# Patient Record
Sex: Female | Born: 1965 | Race: White | Hispanic: No | Marital: Single | Smoking: Former smoker
Health system: Southern US, Community
[De-identification: ages and names within clinical notes are randomized; demographics above are authoritative.]

## PROBLEM LIST (undated history)

## (undated) DIAGNOSIS — C801 Malignant (primary) neoplasm, unspecified: Secondary | ICD-10-CM

## (undated) DIAGNOSIS — F32A Depression, unspecified: Secondary | ICD-10-CM

## (undated) DIAGNOSIS — M722 Plantar fascial fibromatosis: Secondary | ICD-10-CM

## (undated) DIAGNOSIS — N393 Stress incontinence (female) (male): Secondary | ICD-10-CM

## (undated) DIAGNOSIS — D649 Anemia, unspecified: Secondary | ICD-10-CM

## (undated) DIAGNOSIS — F329 Major depressive disorder, single episode, unspecified: Secondary | ICD-10-CM

## (undated) DIAGNOSIS — F419 Anxiety disorder, unspecified: Secondary | ICD-10-CM

## (undated) HISTORY — DX: Plantar fascial fibromatosis: M72.2

## (undated) HISTORY — DX: Malignant (primary) neoplasm, unspecified: C80.1

## (undated) HISTORY — DX: Anemia, unspecified: D64.9

## (undated) HISTORY — DX: Anxiety disorder, unspecified: F41.9

## (undated) HISTORY — PX: TUBAL LIGATION: SHX77

## (undated) HISTORY — DX: Depression, unspecified: F32.A

## (undated) HISTORY — DX: Stress incontinence (female) (male): N39.3

---
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
## 1991-05-07 DIAGNOSIS — C801 Malignant (primary) neoplasm, unspecified: Secondary | ICD-10-CM

## 1991-05-07 HISTORY — DX: Malignant (primary) neoplasm, unspecified: C80.1

## 2003-03-13 ENCOUNTER — Inpatient Hospital Stay (HOSPITAL_COMMUNITY): Admission: AD | Admit: 2003-03-13 | Discharge: 2003-03-13 | Payer: Self-pay | Admitting: Obstetrics and Gynecology

## 2003-10-05 ENCOUNTER — Emergency Department (HOSPITAL_COMMUNITY): Admission: EM | Admit: 2003-10-05 | Discharge: 2003-10-05 | Payer: Self-pay | Admitting: Family Medicine

## 2004-06-03 ENCOUNTER — Emergency Department (HOSPITAL_COMMUNITY): Admission: EM | Admit: 2004-06-03 | Discharge: 2004-06-03 | Payer: Self-pay | Admitting: Emergency Medicine

## 2004-10-22 ENCOUNTER — Other Ambulatory Visit: Admission: RE | Admit: 2004-10-22 | Discharge: 2004-10-22 | Payer: Self-pay | Admitting: Obstetrics and Gynecology

## 2005-02-19 ENCOUNTER — Emergency Department (HOSPITAL_COMMUNITY): Admission: EM | Admit: 2005-02-19 | Discharge: 2005-02-19 | Payer: Self-pay | Admitting: Family Medicine

## 2006-07-17 ENCOUNTER — Ambulatory Visit: Payer: Self-pay | Admitting: Internal Medicine

## 2006-07-31 ENCOUNTER — Encounter (INDEPENDENT_AMBULATORY_CARE_PROVIDER_SITE_OTHER): Payer: Self-pay | Admitting: Internal Medicine

## 2006-07-31 ENCOUNTER — Ambulatory Visit: Payer: Self-pay | Admitting: Internal Medicine

## 2006-08-13 ENCOUNTER — Ambulatory Visit (HOSPITAL_COMMUNITY): Admission: RE | Admit: 2006-08-13 | Discharge: 2006-08-13 | Payer: Self-pay | Admitting: Internal Medicine

## 2006-08-27 ENCOUNTER — Encounter: Admission: RE | Admit: 2006-08-27 | Discharge: 2006-08-27 | Payer: Self-pay | Admitting: Internal Medicine

## 2006-08-27 ENCOUNTER — Ambulatory Visit: Payer: Self-pay | Admitting: *Deleted

## 2006-09-16 ENCOUNTER — Ambulatory Visit (HOSPITAL_COMMUNITY): Admission: RE | Admit: 2006-09-16 | Discharge: 2006-09-16 | Payer: Self-pay | Admitting: Internal Medicine

## 2006-12-19 ENCOUNTER — Ambulatory Visit: Payer: Self-pay | Admitting: Obstetrics and Gynecology

## 2006-12-31 DIAGNOSIS — N946 Dysmenorrhea, unspecified: Secondary | ICD-10-CM | POA: Insufficient documentation

## 2006-12-31 DIAGNOSIS — F410 Panic disorder [episodic paroxysmal anxiety] without agoraphobia: Secondary | ICD-10-CM | POA: Insufficient documentation

## 2006-12-31 DIAGNOSIS — N83209 Unspecified ovarian cyst, unspecified side: Secondary | ICD-10-CM | POA: Insufficient documentation

## 2006-12-31 DIAGNOSIS — R87619 Unspecified abnormal cytological findings in specimens from cervix uteri: Secondary | ICD-10-CM | POA: Insufficient documentation

## 2006-12-31 DIAGNOSIS — F17200 Nicotine dependence, unspecified, uncomplicated: Secondary | ICD-10-CM | POA: Insufficient documentation

## 2007-04-17 ENCOUNTER — Ambulatory Visit: Payer: Self-pay | Admitting: Internal Medicine

## 2007-04-17 DIAGNOSIS — K089 Disorder of teeth and supporting structures, unspecified: Secondary | ICD-10-CM | POA: Insufficient documentation

## 2007-09-06 IMAGING — US US TRANSVAGINAL NON-OB
1 series · 14 of 25 positions shown · non-contrast
Comparison: none

CLINICAL DATA: Right pelvic pain. 
 TRANSABDOMINAL AND TRANSVAGINAL PELVIC ULTRASOUND ? 09/16/06 AT 5511 HOURS:
TECHNIQUE: Both transabdominal and transvaginal ultrasound examinations of the pelvis were performed including evaluation of the uterus, ovaries, adnexal regions, and pelvic cul-de-sac.

[Series 1: unknown · 0.35mm/px · 14 of 67 slices shown]
[im 1/67]
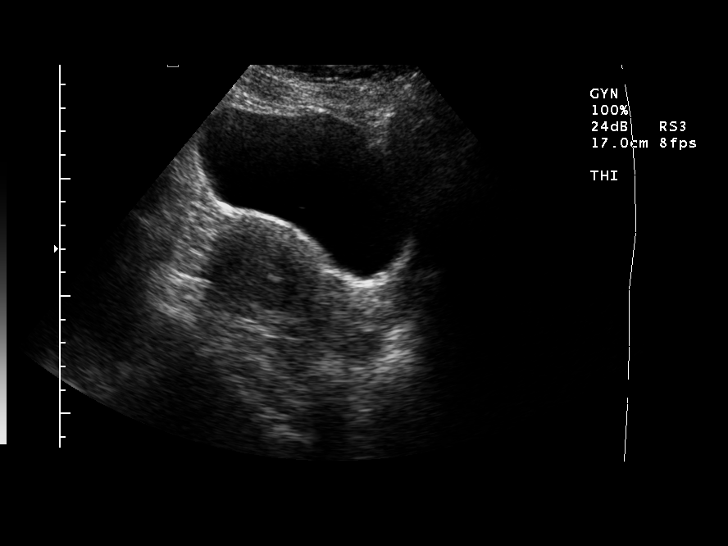
[im 6/67]
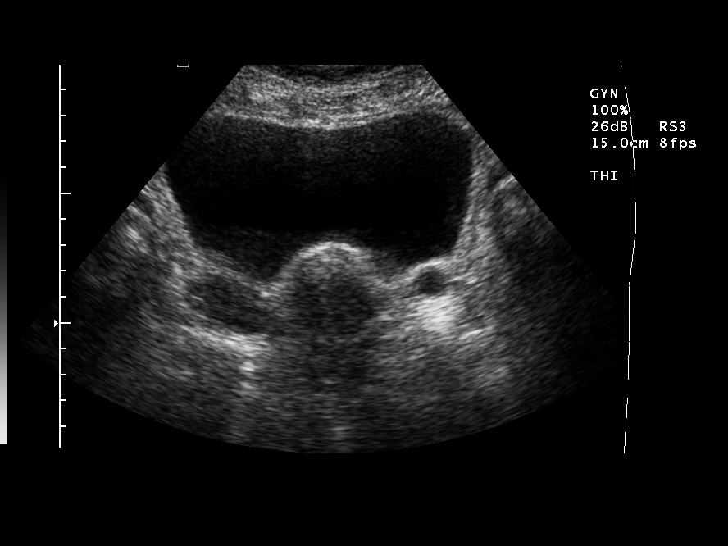
[im 12/67]
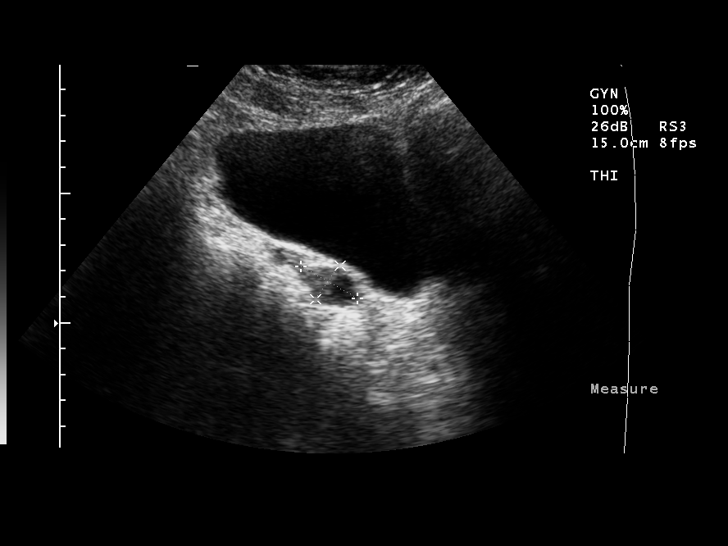
[im 17/67]
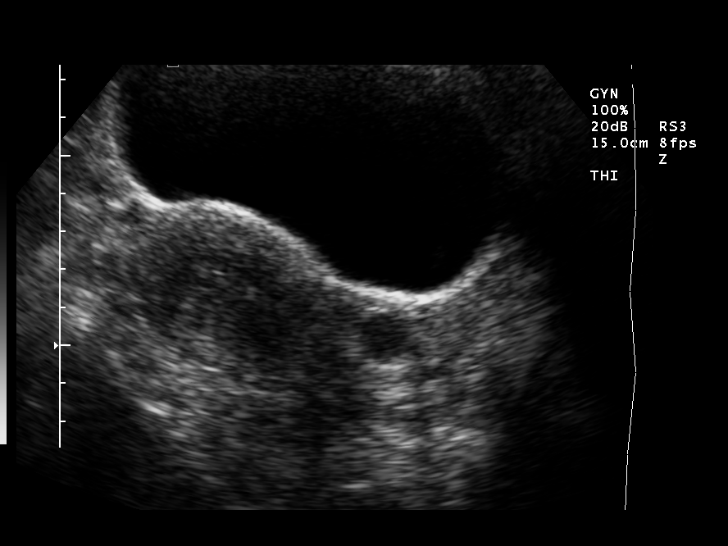
[im 23/67]
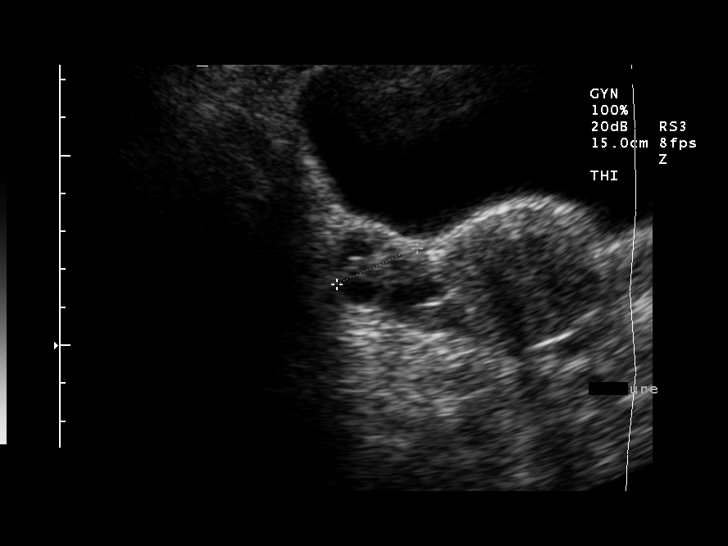
[im 25/67]
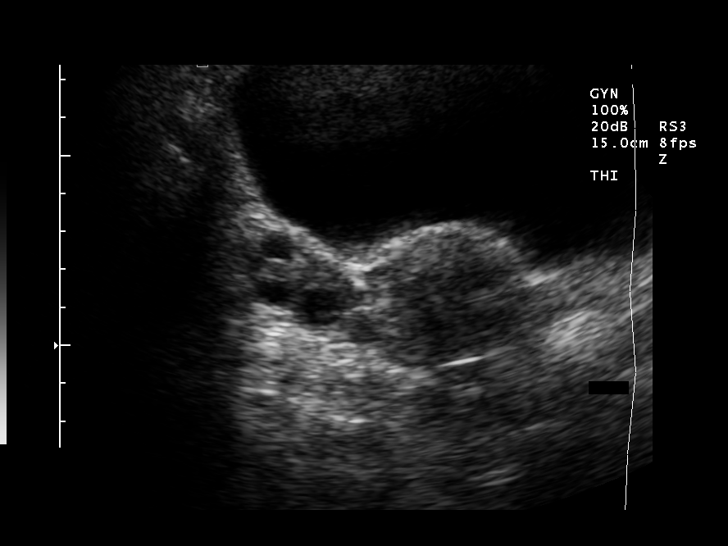
[im 31/67]
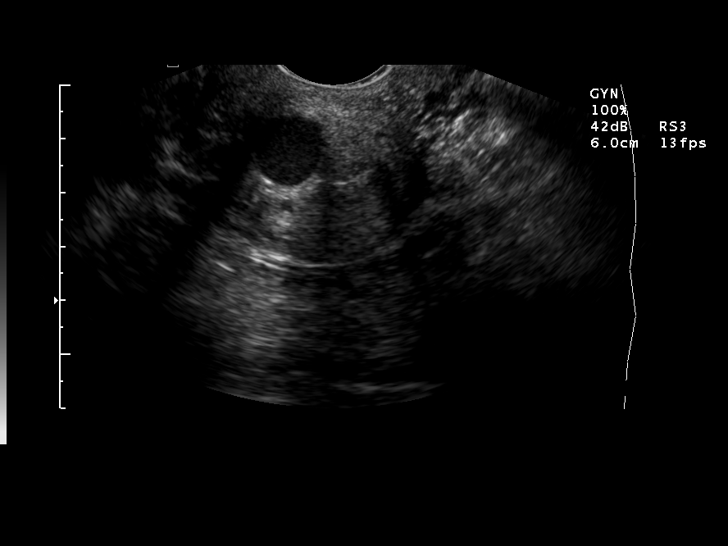
[im 36/67]
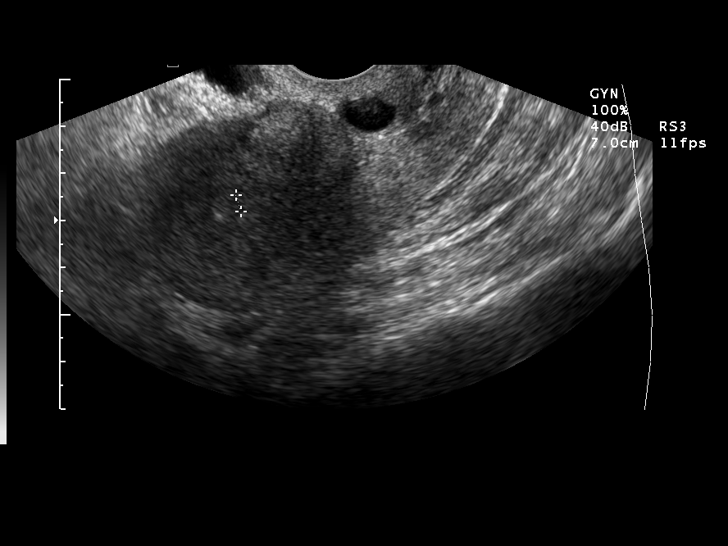
[im 42/67]
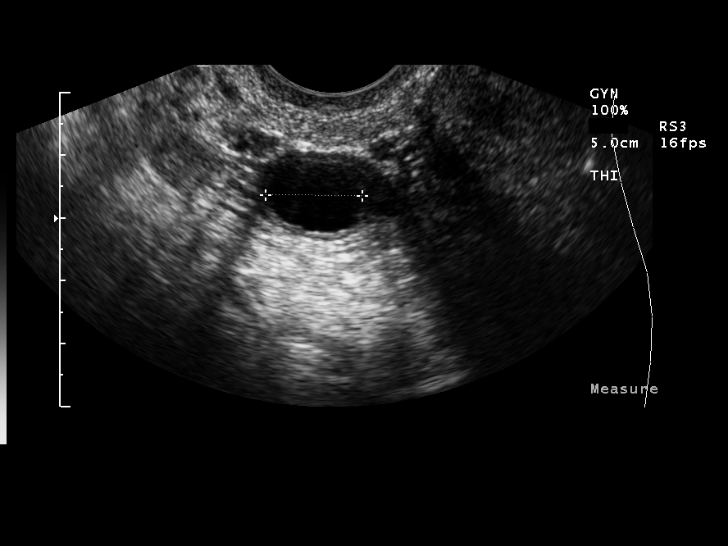
[im 45/67]
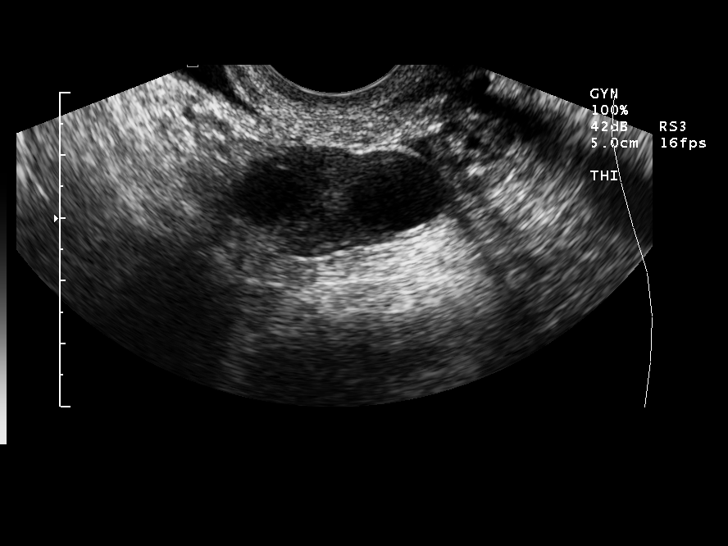
[im 50/67]
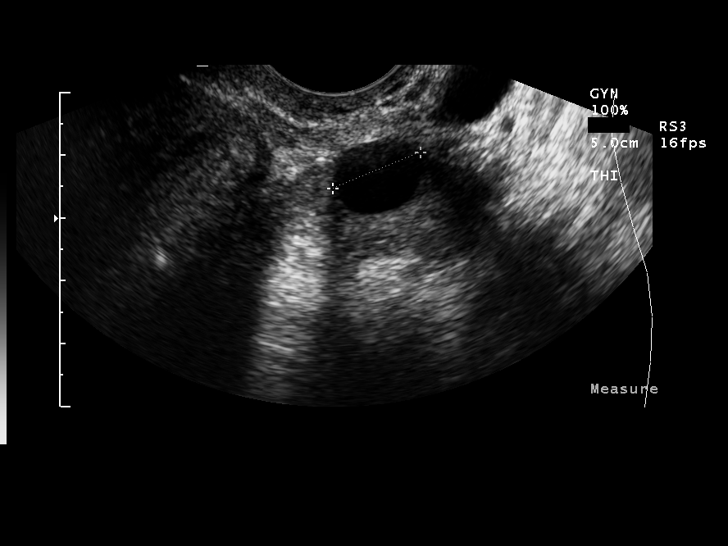
[im 56/67]
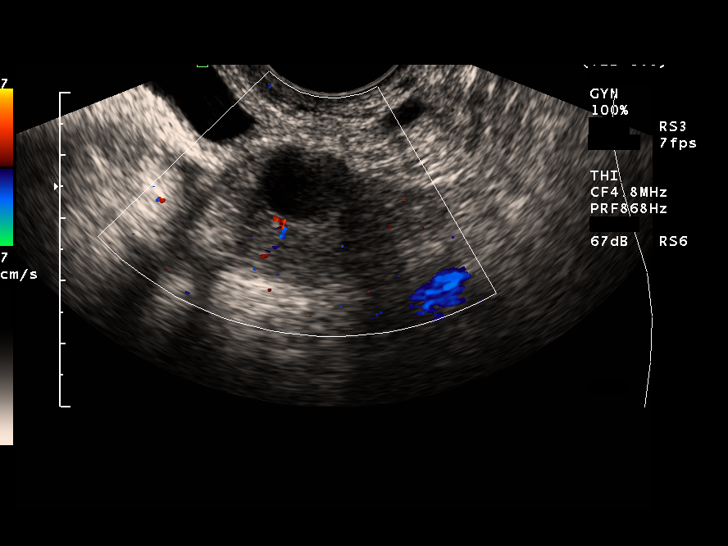
[im 61/67]
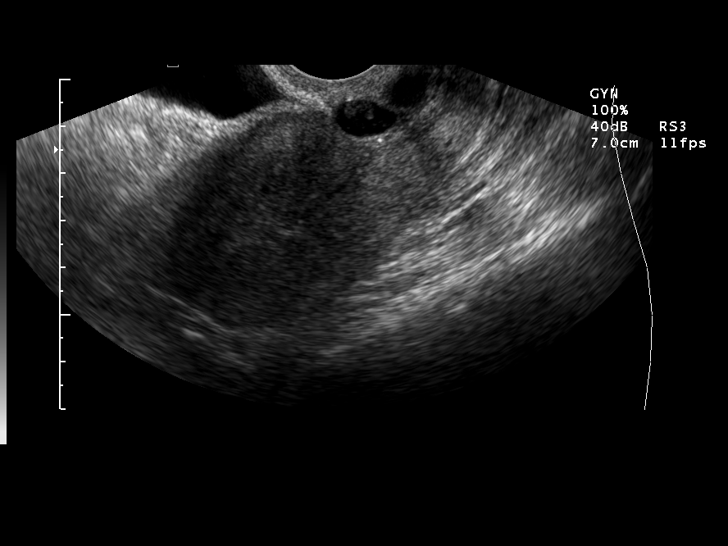
[im 67/67]
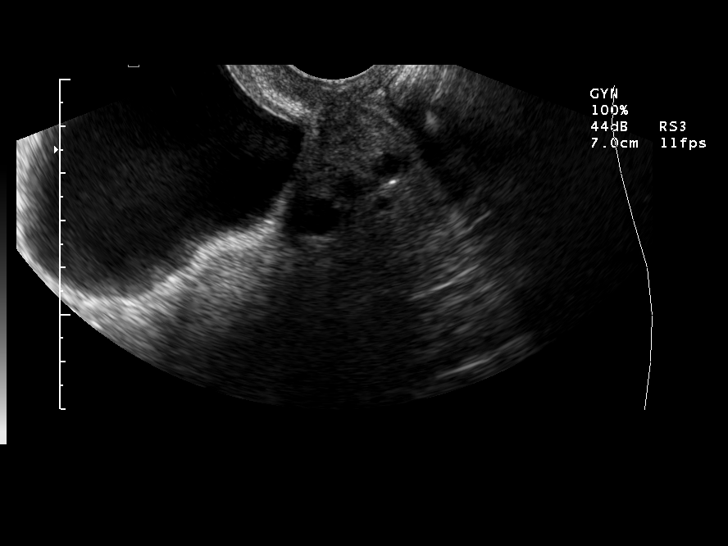

[14 of 25 positions shown; findings below may reference images not displayed]

FINDINGS: The uterus is 8.1 x 3.8 x 4.6 cm.  There is a heterogeneous mass in the anterior aspect of the uterine fundus measuring 2.3 x 1.8 x 1.8 cm, most consistent with a small fibroid.  There is an echogenic focus within the region of the endometrial stripe of unknown significance.  The endometrial stripe is 4 mm and uniform otherwise. 
 The ovaries are within normal limits. 
 Negative free fluid.
IMPRESSION: 1.  Solitary uterine fibroid.  
 2.  Echogenic focus in the endometrium.   This may represent gas, a small focus of hemorrhage, or calcification.

## 2007-10-14 ENCOUNTER — Telehealth (INDEPENDENT_AMBULATORY_CARE_PROVIDER_SITE_OTHER): Payer: Self-pay | Admitting: Internal Medicine

## 2008-11-04 ENCOUNTER — Telehealth (INDEPENDENT_AMBULATORY_CARE_PROVIDER_SITE_OTHER): Payer: Self-pay | Admitting: Internal Medicine

## 2008-11-09 ENCOUNTER — Encounter (INDEPENDENT_AMBULATORY_CARE_PROVIDER_SITE_OTHER): Payer: Self-pay | Admitting: Internal Medicine

## 2008-11-24 ENCOUNTER — Ambulatory Visit: Payer: Self-pay | Admitting: Internal Medicine

## 2008-11-24 DIAGNOSIS — N3941 Urge incontinence: Secondary | ICD-10-CM | POA: Insufficient documentation

## 2008-11-24 DIAGNOSIS — L259 Unspecified contact dermatitis, unspecified cause: Secondary | ICD-10-CM | POA: Insufficient documentation

## 2008-11-24 DIAGNOSIS — K439 Ventral hernia without obstruction or gangrene: Secondary | ICD-10-CM | POA: Insufficient documentation

## 2008-11-25 ENCOUNTER — Encounter (INDEPENDENT_AMBULATORY_CARE_PROVIDER_SITE_OTHER): Payer: Self-pay | Admitting: Internal Medicine

## 2008-12-12 ENCOUNTER — Ambulatory Visit (HOSPITAL_BASED_OUTPATIENT_CLINIC_OR_DEPARTMENT_OTHER): Admission: RE | Admit: 2008-12-12 | Discharge: 2008-12-12 | Payer: Self-pay | Admitting: General Surgery

## 2010-05-27 ENCOUNTER — Encounter: Payer: Self-pay | Admitting: Internal Medicine

## 2010-05-27 ENCOUNTER — Encounter: Payer: Self-pay | Admitting: Occupational Therapy

## 2010-05-27 ENCOUNTER — Encounter: Payer: Self-pay | Admitting: Obstetrics and Gynecology

## 2010-07-13 ENCOUNTER — Encounter: Payer: Self-pay | Admitting: Internal Medicine

## 2010-07-13 ENCOUNTER — Encounter (INDEPENDENT_AMBULATORY_CARE_PROVIDER_SITE_OTHER): Payer: Self-pay | Admitting: Internal Medicine

## 2010-07-13 DIAGNOSIS — M722 Plantar fascial fibromatosis: Secondary | ICD-10-CM | POA: Insufficient documentation

## 2010-07-17 NOTE — Assessment & Plan Note (Signed)
Summary: Pain on both feet   Vital Signs:  Patient profile:   45 year old female Menstrual status:  irregular LMP:     07/01/2010 Weight:      151.13 pounds BMI:     26.87 Temp:     98.0 degrees F oral Pulse rate:   74 / minute Pulse rhythm:   regular Resp:     18 per minute BP sitting:   112 / 66  (left arm) Cuff size:   regular  Vitals Entered By: Hale Drone CMA (July 13, 2010 12:17 PM) CC: Having pain on both feet x2 months. Pain is worst in the morining when she wakes up and puts pressure. the pain will radiate to her calves.... Also needs refill on her Prozac.  Is Patient Diabetic? No Pain Assessment Patient in pain? yes       Does patient need assistance? Functional Status Self care Ambulation Normal LMP (date): 07/01/2010     Menstrual Status irregular Enter LMP: 07/01/2010   CC:  Having pain on both feet x2 months. Pain is worst in the morining when she wakes up and puts pressure. the pain will radiate to her calves.... Also needs refill on her Prozac. Marland Kitchen  History of Present Illness: 1.  Bilateral plantar foot pain:  Radiates up into gastroc areas bilaterally.  Has been a problem for 3 months.  Waitressing and bartending all day.  Wears hard soled shoes.  Does use inserts--though thin.  On cement and hard floors all day.  Does go barefoot at home.    2.  Out of Fluoxetine for 3-4 months--feeling very stressed  Current Medications (verified): 1)  Detrol La 4 Mg  Cp24 (Tolterodine Tartrate) .... Take 1 Tablet By Mouth Once A Day 2)  Prozac 20 Mg  Caps (Fluoxetine Hcl) .... Take 1 Tablet By Mouth Once A Day 3)  Clobetasol Propionate 0.05 % Crea (Clobetasol Propionate) .... Apply Two Times A Day To Affected Area  Allergies (verified): No Known Drug Allergies  Physical Exam  General:  NAD Extremities:  Tender over plantar aspect of feet and gastroc area bilaterally.  No erythema or swelling.   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 1 cap by mouth daily  Complete Medication List: 1)  Detrol La 4 Mg Cp24 (Tolterodine tartrate) .... Take 1 tablet by mouth once a day 2)  Fluoxetine Hcl 20 Mg Caps (Fluoxetine hcl) .Marland Kitchen.. 1 cap by mouth daily 3)  Clobetasol Propionate 0.05 % Crea (Clobetasol propionate) .... Apply two times a day to affected area  Patient Instructions: 1)  Reschedule first available for CPP with Dr. Delrae Alfred 2)  Aleve (Naproxen)  225 mg 2 tabs by mouth two times a day with food.  Take for 2 weeks regularly. 3)  Call if you do not hear from Physical therapy in next week. Prescriptions: FLUOXETINE HCL 20 MG CAPS (FLUOXETINE HCL) 1 cap by mouth daily  #30 x 11   Entered and Authorized by:   Julieanne Manson MD   Signed by:   Julieanne Manson MD on 07/13/2010   Method used:   Electronically to        [facility] [phone] (retail)       [address]  [place], [place]  [address]       Ph: [phone]       Fax: [phone]   RxID:   [id number]    Orders Added: 1)  Physical Therapy Referral [PT] 2)  Est. Patient Level III [84132]

## 2010-08-11 LAB — COMPREHENSIVE METABOLIC PANEL
ALT: 21 U/L (ref 0–35)
AST: 28 U/L (ref 0–37)
Albumin: 4 g/dL (ref 3.5–5.2)
Alkaline Phosphatase: 57 U/L (ref 39–117)
BUN: 7 mg/dL (ref 6–23)
CO2: 29 mEq/L (ref 19–32)
Calcium: 9.1 mg/dL (ref 8.4–10.5)
Chloride: 103 mEq/L (ref 96–112)
Creatinine, Ser: 0.9 mg/dL (ref 0.4–1.2)
GFR calc Af Amer: >60 mL/min (ref >60)
GFR calc non Af Amer: >60 mL/min (ref >60)
Glucose, Bld: 102 mg/dL — ABNORMAL HIGH (ref 70–99)
Potassium: 4 mEq/L (ref 3.5–5.1)
Sodium: 139 mEq/L (ref 135–145)
Total Bilirubin: 0.7 mg/dL (ref 0.3–1.2)
Total Protein: 7 g/dL (ref 6.0–8.3)

## 2010-08-11 LAB — URINALYSIS, ROUTINE W REFLEX MICROSCOPIC
Bilirubin Urine: NEGATIVE
Glucose, UA: NEGATIVE mg/dL
Hgb urine dipstick: NEGATIVE
Ketones, ur: NEGATIVE mg/dL
Nitrite: NEGATIVE
Protein, ur: NEGATIVE mg/dL
Specific Gravity, Urine: 1.027 (ref 1.005–1.030)
Urobilinogen, UA: 0.2 mg/dL (ref 0.0–1.0)
pH: 5.5 (ref 5.0–8.0)

## 2010-08-11 LAB — DIFFERENTIAL
Basophils Absolute: 0 10*3/uL (ref 0.0–0.1)
Basophils Relative: 1 % (ref 0–1)
Eosinophils Absolute: 0.3 10*3/uL (ref 0.0–0.7)
Eosinophils Relative: 7 % — ABNORMAL HIGH (ref 0–5)
Lymphocytes Relative: 40 % (ref 12–46)
Lymphs Abs: 1.9 10*3/uL (ref 0.7–4.0)
Monocytes Absolute: 0.5 10*3/uL (ref 0.1–1.0)
Monocytes Relative: 10 % (ref 3–12)
Neutro Abs: 2 10*3/uL (ref 1.7–7.7)
Neutrophils Relative %: 42 % — ABNORMAL LOW (ref 43–77)

## 2010-08-11 LAB — CBC
HCT: 34.8 % — ABNORMAL LOW (ref 36.0–46.0)
Hemoglobin: 11.6 g/dL — ABNORMAL LOW (ref 12.0–15.0)
MCHC: 33.3 g/dL (ref 30.0–36.0)
MCV: 75.2 fL — ABNORMAL LOW (ref 78.0–100.0)
Platelets: 232 10*3/uL (ref 150–400)
RBC: 4.62 MIL/uL (ref 3.87–5.11)
RDW: 21.8 % — ABNORMAL HIGH (ref 11.5–15.5)
WBC: 4.7 10*3/uL (ref 4.0–10.5)

## 2010-09-18 NOTE — Op Note (Signed)
NAMECHONITA, GADEA             ACCOUNT NO.:  [id number]   MEDICAL RECORD NO.:  [id number]          PATIENT TYPE:  AMB   LOCATION:  [facility]                          FACILITY:  [facility]   PHYSICIAN:  Angelia Mould. Derrell Lolling, M.D.DATE OF BIRTH:  October 25, 1965   DATE OF PROCEDURE:  12/12/2008  DATE OF DISCHARGE:                               OPERATIVE REPORT   PREOPERATIVE DIAGNOSIS:  Incarcerated umbilical hernia.   POSTOPERATIVE DIAGNOSIS:  Incarcerated umbilical hernia.   OPERATION PERFORMED:  Repair of incarcerated umbilical hernia.   SURGEON:  Angelia Mould. Derrell Lolling, MD   OPERATIVE INDICATIONS:  This is a 45 year old woman who has had a bulge  and pain above her umbilicus for about 2 years.  She has had no prior  history of hernia surgery.  On exam, she has an incarcerated umbilical  hernia projecting at the superior rim of the umbilicus in the midline.  No underlying skin change.  This is a little bit tender.  I could not  reduce this.  She is brought to operating room electively.   OPERATIVE TECHNIQUE:  Following induction of a general LMA anesthetic,  the patient's abdomen was prepped and draped in a sterile fashion.  Intravenous antibiotics were given.  The patient was identified as  correct patient, correct procedure, and correct site.  Marcaine 0.5%  with epinephrine was used as local infiltration anesthetic.  A curved  transverse incision was made at the lower rim of the umbilicus.  Dissection was carried down through the subcutaneous tissue to the  umbilicus.  I dissected all the way around the umbilicus and then  dissected the umbilical skin off the fascia.  The patient actually had a  very small defect with incarcerated preperitoneal fat.  I debrided the  preperitoneal fat.  I was just able to get my index finger through the  defect and did feel circumferentially and I found that there was no  other defect in the fascia in any direction.  Because the defect was so  small, I chose to  close this primarily with 4 interrupted sutures of #1  Novafil.  At the corners, these were simple sutures and then centrally I  placed 2 sutures of #1 Novafil and passed over vest-over-pants fashion.  After all 4 sutures were placed, we then lifted up on the sutures and  found that the fascia overlapped quite nicely.  The sutures were all  tied and the repair looked secure.  The wound was irrigated with saline.  The umbilical skin was tacked back to the fascia with 3-0 Vicryl suture.  Subcutaneous tissue was closed with interrupted sutures of 3-0 Vicryl  and the skin was closed with a running subcuticular suture of 4-0  Monocryl and Dermabond.  Clean bandages were placed and the patient was  taken recovery room in stable condition.  Estimated blood loss was about  10 mL or less.  Complications none.  Sponge, needle, and instrument  counts were correct.      Angelia Mould. Derrell Lolling, M.D.  Electronically Signed    HMI/MEDQ  D:  12/12/2008  T:  12/13/2008  Job:  [id number]   cc:   Marcene Duos, M.D.

## 2010-09-18 NOTE — Group Therapy Note (Signed)
NAME:  Traci Oliver, Traci Oliver NO.:  [id number]   MEDICAL RECORD NO.:  [id number]          PATIENT TYPE:  WOC   LOCATION:  [facility]                   FACILITY:  [facility]   PHYSICIAN:  Argentina Donovan, MD        DATE OF BIRTH:  02/22/1966   DATE OF SERVICE:                                  CLINIC NOTE   Patient is here at a consult post ultrasound regarding a hypoechoic  lesion that was seen on her ultrasound.  She was also referred from  [facility] for dysmenorrhea and menorrhagia.  Patient states that she  has been having pain with her menstrual periods, and have intensified  over 1 year ago, at which time she had an ultrasound, which showed a  fibroid that she was told that was about the size of a grapefruit.  She  states that her periods occur about every 28 days, and last for 7 days  of heavy bleeding.  She has some spotting in between periods, but no  heavy bleeding like during her periods.  She does state that her pain  and the bleeding have improved over the past 1 year.  She takes  ibuprofen as needed for pain, otherwise does not take any medications.  Of note, she is a G3, P3.  On the ultrasound done on Sep 16, 2006, that  heterogeneous mass was noted in the anterior fundus, and measured 2.3 cm  x 1.8 cm x 1.8 cm, and felt to be a small fibroid. Area of concern noted  on that ultrasound was echogenic focus within the region of endometrial  stripe, which was of unknown significance, but felt to represent either  gas, small focus of hemorrhage, or calcification.  Endometrial stripe  was 4 mm and uniform.   ASSESSMENT AND PLAN:  This is a 45 year old female with dysmenorrhea,  menorrhagia, hypoechoic lesion on ultrasound in the endometrium.  I went  over patient's ultrasound result, including a fibroid, which appears  smaller compared to previous, and is reflected in her decreased  symptoms.  I do not feel there is anything that needs to be done about  the fibroid.   Offered her a repeat ultrasound to assess this and the  hypoechoic lesion, which she declined, and felt this did not necessarily  need to be done.  As far as patient's echogenic focus, I feel this is  benign also, and does not warrant any further followup.  If patient's  dysmenorrhea or menorrhagia continues, or she has further complaints, I  feel either a D and C or further evaluation by [place] may be needed, but at  this time, would not recommend any surgical intervention.     ______________________________  Unknown dictator    ______________________________  Argentina Donovan, MD    /MEDQ  D:  12/19/2006  T:  12/19/2006  Job:  [id number]   cc:   [facility]  Phone number [phone]  Fax number [phone]

## 2015-02-01 ENCOUNTER — Encounter: Payer: Self-pay | Admitting: Advanced Practice Midwife

## 2015-02-01 ENCOUNTER — Ambulatory Visit (INDEPENDENT_AMBULATORY_CARE_PROVIDER_SITE_OTHER): Payer: 59 | Admitting: Advanced Practice Midwife

## 2015-02-01 VITALS — BP 129/84 | HR 75 | Ht 63.0 in | Wt 151.0 lb

## 2015-02-01 DIAGNOSIS — J0110 Acute frontal sinusitis, unspecified: Secondary | ICD-10-CM

## 2015-02-01 DIAGNOSIS — Z124 Encounter for screening for malignant neoplasm of cervix: Secondary | ICD-10-CM

## 2015-02-01 DIAGNOSIS — Z01419 Encounter for gynecological examination (general) (routine) without abnormal findings: Secondary | ICD-10-CM

## 2015-02-01 DIAGNOSIS — Z1151 Encounter for screening for human papillomavirus (HPV): Secondary | ICD-10-CM | POA: Diagnosis not present

## 2015-02-01 DIAGNOSIS — F419 Anxiety disorder, unspecified: Secondary | ICD-10-CM

## 2015-02-01 DIAGNOSIS — F39 Unspecified mood [affective] disorder: Secondary | ICD-10-CM

## 2015-02-01 DIAGNOSIS — R4586 Emotional lability: Secondary | ICD-10-CM

## 2015-02-01 DIAGNOSIS — Z8041 Family history of malignant neoplasm of ovary: Secondary | ICD-10-CM

## 2015-02-01 DIAGNOSIS — N951 Menopausal and female climacteric states: Secondary | ICD-10-CM

## 2015-02-01 MED ORDER — TRAZODONE HCL 50 MG PO TABS: 50.0000 mg | ORAL_TABLET | Freq: Every day | ORAL | Status: DC

## 2015-02-01 MED ORDER — AZITHROMYCIN 250 MG PO TABS
ORAL_TABLET | ORAL | Status: DC
Start: 2015-02-01 — End: 2015-09-10

## 2015-02-01 MED ORDER — ESTRADIOL 2 MG VA RING: 2.0000 mg | VAGINAL_RING | VAGINAL | Status: DC

## 2015-02-01 NOTE — Progress Notes (Signed)
Subjective:     Traci Oliver is a 49 y.o. female here for a routine exam.  Current complaints: hot-flashes and mood swings and URI x 1 week.  She took trazodone in the past which helped with anxiety symptoms and would like to try this again.  She feels like her URI is worsening with deep dry cough.  She recently started new insurance and does not have primary care appointment yet but plans to see Dr Hardin Negus in [place].   Gynecologic History No LMP recorded. Patient is postmenopausal. Contraception: none Last Pap: unknown. Results were: normal  Obstetric History OB History  Gravida Para Term Preterm AB SAB TAB Ectopic Multiple Living  2 2 1 1     1 3     # Outcome Date GA Lbr Len/2nd Weight Sex Delivery Anes PTL Lv  2 Term           1 Preterm                The following portions of the patient's history were reviewed and updated as appropriate: allergies, current medications, past family history, past medical history, past social history, past surgical history and problem list.  Review of Systems  Constitutional: Negative for fever, chills and malaise/fatigue.  Eyes: Negative for blurred vision.  Respiratory: Negative for cough and shortness of breath.   Cardiovascular: Negative for chest pain.  Gastrointestinal: Negative for heartburn, nausea and vomiting.  Genitourinary: Negative for dysuria, urgency and frequency.  Musculoskeletal: Negative.   Neurological: Negative for dizziness and headaches.  Psychiatric/Behavioral: Negative for depression.    Objective:    BP 129/84 mmHg  Pulse 75  Ht 5\' 3"  (1.6 m)  Wt 151 lb (68.493 kg)  BMI 26.76 kg/m2 , GENERAL: Well-developed, well-nourished female in no acute distress.  HEENT: Normocephalic, atraumatic. Sclerae anicteric.  NECK: Supple. Normal thyroid.  LUNGS: Clear to auscultation bilaterally.  HEART: Regular rate and rhythm. BREASTS: Symmetric in size. No masses, skin changes, nipple drainage, or  lymphadenopathy. ABDOMEN: Soft, nontender, nondistended. No organomegaly. PELVIC: Normal external female genitalia. Vagina is pink and rugated.  Normal discharge. Normal cervix contour. Pap smear obtained. Uterus is normal in size. No adnexal mass or tenderness.  EXTREMITIES: No cyanosis, clubbing, or edema, 2+ distal pulses.     Assessment:    Healthy female exam.    Plan:     Education reviewed: depression evaluation, low fat, low cholesterol diet, skin cancer screening, smoking cessation, weight bearing exercise and HRT, risks, benefits, goals.  Pap collected Consult Dr Gala Romney Trazodone 50 mg Q HS Z-pak Estring 2 mg Q 3 mo F/U with primary care  Return in 1 year or PRN

## 2015-02-03 DIAGNOSIS — N951 Menopausal and female climacteric states: Secondary | ICD-10-CM | POA: Insufficient documentation

## 2015-02-03 DIAGNOSIS — R4586 Emotional lability: Secondary | ICD-10-CM | POA: Insufficient documentation

## 2015-02-03 DIAGNOSIS — F419 Anxiety disorder, unspecified: Secondary | ICD-10-CM | POA: Insufficient documentation

## 2015-02-03 DIAGNOSIS — Z8041 Family history of malignant neoplasm of ovary: Secondary | ICD-10-CM | POA: Insufficient documentation

## 2015-02-03 DIAGNOSIS — F32A Depression, unspecified: Secondary | ICD-10-CM | POA: Insufficient documentation

## 2015-02-03 LAB — CYTOLOGY - PAP

## 2015-02-21 ENCOUNTER — Ambulatory Visit
Admission: RE | Admit: 2015-02-21 | Discharge: 2015-02-21 | Disposition: A | Discharge status: Home / Self Care | Payer: 59 | Source: Ambulatory Visit | Attending: Advanced Practice Midwife | Admitting: Advanced Practice Midwife

## 2015-02-21 DIAGNOSIS — Z01419 Encounter for gynecological examination (general) (routine) without abnormal findings: Secondary | ICD-10-CM

## 2015-02-22 ENCOUNTER — Telehealth: Payer: Self-pay | Admitting: *Deleted

## 2015-02-22 NOTE — Telephone Encounter (Signed)
-----   Message from Elvera Maria, CNM sent at 02/22/2015  8:44 AM EDT ----- Regarding: RE: RX Question Contact: [phone] I only prescribed trazadone because pt reported she did not have current primary care provider but was working on it. I simply re-prescribed what she had taken in the past.  These are controlled substances and pt should see primary care or behavioral health for additional medications.  I do not feel comfortable switching these without more evaluation.  She can see MD at [facility] if someone else is more comfortable addressing pt anxiety before she sees primary care.  Thank you.  ----- Message -----    From: Ricka Burdock, RN    Sent: 02/21/2015   5:21 PM      To: Elvera Maria, CNM Subject: FW: RX Question                                Following up on pt request for medication change. Please advise. Thanks ----- Message -----    From: Francia Greaves    Sent: 02/20/2015  10:31 AM      To: Ricka Burdock, RN Subject: RX Question                                    Patient called and had a question about her medicine, orginally prescribed Xanax, she preferred Trazodone, now Trazodone is giving her a headache and making her dizzy, wants to know if she can get the original Rx for Xanax.

## 2015-02-22 NOTE — Telephone Encounter (Signed)
Spoke to pt, informed her that Traci Oliver would not be able to refill any further medications at this time and recommended her to follow-up with a PCP to be able to manage her medications.  Pt acknowledged instructions and stated she had a PCP that she would call to set up an appointment.  Informed pt that we would be glad to assist her with this process as well.  Pt will call back with any further issues.

## 2015-02-22 NOTE — Telephone Encounter (Signed)
-----   Message from Elvera Maria, CNM sent at 02/22/2015  8:44 AM EDT ----- Regarding: RE: RX Question Contact: [phone] I only prescribed trazadone because pt reported she did not have current primary care provider but was working on it. I simply re-prescribed what she had taken in the past.  These are controlled substances and pt should see primary care or behavioral health for additional medications.  I do not feel comfortable switching these without more evaluation.  She can see MD at [facility] if someone else is more comfortable addressing pt anxiety before she sees primary care.  Thank you.  ----- Message -----    From: Ricka Burdock, RN    Sent: 02/21/2015   5:21 PM      To: Elvera Maria, CNM Subject: FW: RX Question                                Following up on pt request for medication change. Please advise. Thanks ----- Message -----    From: Francia Greaves    Sent: 02/20/2015  10:31 AM      To: Ricka Burdock, RN Subject: RX Question                                    Patient called and had a question about her medicine, orginally prescribed Xanax, she preferred Trazodone, now Trazodone is giving her a headache and making her dizzy, wants to know if she can get the original Rx for Xanax.

## 2015-02-22 NOTE — Telephone Encounter (Signed)
Called pt, no answer, left message for pt to call office.

## 2015-09-10 ENCOUNTER — Encounter (HOSPITAL_COMMUNITY): Payer: Self-pay | Admitting: Family Medicine

## 2015-09-10 ENCOUNTER — Emergency Department (HOSPITAL_COMMUNITY)
Admission: EM | Admit: 2015-09-10 | Discharge: 2015-09-10 | Disposition: A | Discharge status: Home / Self Care | Payer: 59 | Attending: Emergency Medicine | Admitting: Emergency Medicine

## 2015-09-10 DIAGNOSIS — Z3202 Encounter for pregnancy test, result negative: Secondary | ICD-10-CM | POA: Diagnosis not present

## 2015-09-10 DIAGNOSIS — F17200 Nicotine dependence, unspecified, uncomplicated: Secondary | ICD-10-CM | POA: Diagnosis not present

## 2015-09-10 DIAGNOSIS — R109 Unspecified abdominal pain: Secondary | ICD-10-CM | POA: Diagnosis present

## 2015-09-10 DIAGNOSIS — Z79899 Other long term (current) drug therapy: Secondary | ICD-10-CM | POA: Insufficient documentation

## 2015-09-10 DIAGNOSIS — R1011 Right upper quadrant pain: Secondary | ICD-10-CM | POA: Insufficient documentation

## 2015-09-10 LAB — CBC
HCT: 41.7 % (ref 36.0–46.0)
Hemoglobin: 13.3 g/dL (ref 12.0–15.0)
MCH: 28 pg (ref 26.0–34.0)
MCHC: 31.9 g/dL (ref 30.0–36.0)
MCV: 87.8 fL (ref 78.0–100.0)
Platelets: 192 10*3/uL (ref 150–400)
RBC: 4.75 MIL/uL (ref 3.87–5.11)
RDW: 12.8 % (ref 11.5–15.5)
WBC: 4.6 10*3/uL (ref 4.0–10.5)

## 2015-09-10 LAB — COMPREHENSIVE METABOLIC PANEL
ALT: 17 U/L (ref 14–54)
AST: 24 U/L (ref 15–41)
Albumin: 4.3 g/dL (ref 3.5–5.0)
Alkaline Phosphatase: 58 U/L (ref 38–126)
Anion gap: 10 (ref 5–15)
BUN: 10 mg/dL (ref 6–20)
CO2: 24 mmol/L (ref 22–32)
Calcium: 9.6 mg/dL (ref 8.9–10.3)
Chloride: 105 mmol/L (ref 101–111)
Creatinine, Ser: 0.79 mg/dL (ref 0.44–1.00)
GFR calc Af Amer: >60 mL/min (ref >60)
GFR calc non Af Amer: >60 mL/min (ref >60)
Glucose, Bld: 107 mg/dL — ABNORMAL HIGH (ref 65–99)
Potassium: 4.2 mmol/L (ref 3.5–5.1)
Sodium: 139 mmol/L (ref 135–145)
Total Bilirubin: 0.7 mg/dL (ref 0.3–1.2)
Total Protein: 6.8 g/dL (ref 6.5–8.1)

## 2015-09-10 LAB — URINALYSIS, ROUTINE W REFLEX MICROSCOPIC
Bilirubin Urine: NEGATIVE
Glucose, UA: NEGATIVE mg/dL
Hgb urine dipstick: NEGATIVE
Ketones, ur: NEGATIVE mg/dL
Leukocytes, UA: NEGATIVE
Nitrite: NEGATIVE
Protein, ur: NEGATIVE mg/dL
Specific Gravity, Urine: 1.004 — ABNORMAL LOW (ref 1.005–1.030)
pH: 7 (ref 5.0–8.0)

## 2015-09-10 LAB — LIPASE, BLOOD: Lipase: 40 U/L (ref 11–51)

## 2015-09-10 LAB — PREGNANCY, URINE: Preg Test, Ur: NEGATIVE

## 2015-09-10 MED ORDER — KETOROLAC TROMETHAMINE 30 MG/ML IJ SOLN
30.0000 mg | Freq: Once | INTRAMUSCULAR | Status: AC
Start: 2015-09-10 — End: 2015-09-10
  Administered 2015-09-10: 30 mg via INTRAVENOUS
  Filled 2015-09-10: qty 1

## 2015-09-10 MED ORDER — IBUPROFEN 600 MG PO TABS: 600.0000 mg | ORAL_TABLET | Freq: Four times a day (QID) | ORAL | Status: DC | PRN

## 2015-09-10 NOTE — ED Notes (Signed)
Pt here for RUQ pain with Nausea. sts sharp pain. sts for a few days and worse with breathing.

## 2015-09-10 NOTE — Discharge Instructions (Signed)
There does not appear to be an emergent cause for your symptoms at this time. Your exam and labs were very reassuring. Take her medication as prescribed. Follow-up with your doctor in the next 2 or 3 days for reevaluation and possible ultrasound if your symptoms continue. Return to ED for new or worsening symptoms as we discussed.  Flank Pain Flank pain refers to pain that is located on the side of the body between the upper abdomen and the back. The pain may occur over a short period of time (acute) or may be long-term or reoccurring (chronic). It may be mild or severe. Flank pain can be caused by many things. CAUSES  Some of the more common causes of flank pain include:  Muscle strains.   Muscle spasms.   A disease of your spine (vertebral disk disease).   A lung infection (pneumonia).   Fluid around your lungs (pulmonary edema).   A kidney infection.   Kidney stones.   A very painful skin rash caused by the chickenpox virus (shingles).   Gallbladder disease.  [place] care will depend on the cause of your pain. In general,  Rest as directed by your caregiver.  Drink enough fluids to keep your urine clear or pale yellow.  Only take over-the-counter or prescription medicines as directed by your caregiver. Some medicines may help relieve the pain.  Tell your caregiver about any changes in your pain.  Follow up with your caregiver as directed. SEEK IMMEDIATE MEDICAL CARE IF:   Your pain is not controlled with medicine.   You have new or worsening symptoms.  Your pain increases.   You have abdominal pain.   You have shortness of breath.   You have persistent nausea or vomiting.   You have swelling in your abdomen.   You feel faint or pass out.   You have blood in your urine.  You have a fever or persistent symptoms for more than 2-3 days.  You have a fever and your symptoms suddenly get worse. MAKE SURE YOU:   Understand  these instructions.  Will watch your condition.  Will get help right away if you are not doing well or get worse.   This information is not intended to replace advice given to you by your health care provider. Make sure you discuss any questions you have with your health care provider.   Document Released: 06/13/2005 Document Revised: 01/15/2012 Document Reviewed: 12/05/2011 Elsevier Interactive Patient Education [employer].

## 2015-09-10 NOTE — ED Notes (Signed)
PA at bedside.

## 2015-09-10 NOTE — ED Provider Notes (Signed)
CSN: [id number]     Arrival date & time 09/10/15  1213 History   First MD Initiated Contact with Patient 09/10/15 1226     Chief Complaint  Patient presents with  . Abdominal Pain     (Consider location/radiation/quality/duration/timing/severity/associated sxs/prior Treatment) HPI Traci Oliver is a 50 y.o. female who comes in for evaluation of right flank pain over the past 2 weeks. Patient reports pain as waxing and waning, worse with deep respiration and certain movements. Not related to eating. Denies any fevers, chills, nausea or vomiting, urinary symptoms, other abdominal pain, chest pain or shortness of breath, cough. Tried taking ibuprofen this morning which was somewhat effective, but pain returned shortly after. Reports drinking roughly 4, 12 ounce beers every other day. Denies any IV drug use. No other alleviating or aggravating factors.  History reviewed. No pertinent past medical history. Past Surgical History  Procedure Laterality Date  . Cesarean section    . Tubal ligation     Family History  Problem Relation Age of Onset  . Cancer Mother     ovarian  . Heart attack Father   . Heart disease Father   . Diabetes Maternal Grandmother   . Cancer Maternal Grandmother     lung  . Hypertension Maternal Grandmother   . Cancer Maternal Grandfather     lung   Social History  Substance Use Topics  . Smoking status: Current Every Day Smoker  . Smokeless tobacco: Never Used  . Alcohol Use: 0.0 oz/week    0 Standard drinks or equivalent per week   OB History    Gravida Para Term Preterm AB TAB SAB Ectopic Multiple Living   2 2 1 1     1 3      Review of Systems A 10 point review of systems was completed and was negative except for pertinent positives and negatives as mentioned in the history of present illness     Allergies  Review of patient's allergies indicates no known allergies.  Home Medications   Prior to Admission medications   Medication Sig Start  Date End Date Taking? Authorizing Provider  clonazePAM (KLONOPIN) 0.5 MG tablet Take 0.25-0.5 mg by mouth 2 (two) times daily as needed for anxiety.  09/09/15  Yes Historical Provider, MD  FLUoxetine (PROZAC) 20 MG capsule Take 20 mg by mouth every morning. 08/11/15  Yes Historical Provider, MD  traZODone (DESYREL) 50 MG tablet Take 1 tablet (50 mg total) by mouth at bedtime. Patient taking differently: Take 50 mg by mouth at bedtime as needed for sleep.  02/01/15  Yes Lisa A Leftwich-Kirby, CNM  ibuprofen (ADVIL,MOTRIN) 600 MG tablet Take 1 tablet (600 mg total) by mouth every 6 (six) hours as needed. 09/10/15   Comer Locket, PA-C   BP 121/70 mmHg  Pulse 66  Temp(Src) 97.9 F (36.6 C) (Oral)  Resp 16  SpO2 98% Physical Exam  Constitutional: She is oriented to person, place, and time. She appears well-developed and well-nourished.  HENT:  Head: Normocephalic and atraumatic.  Mouth/Throat: Oropharynx is clear and moist.  Eyes: Conjunctivae are normal. Pupils are equal, round, and reactive to light. Right eye exhibits no discharge. Left eye exhibits no discharge. No scleral icterus.  Neck: Neck supple.  Cardiovascular: Normal rate, regular rhythm and normal heart sounds.   Pulmonary/Chest: Effort normal and breath sounds normal. No respiratory distress. She has no wheezes. She has no rales.  Abdominal: Soft.  Very mild tenderness in right flank, negative Murphy's. Abdomen is  otherwise soft, nondistended without rebound or guarding. No other lesions or deformities noted.  Musculoskeletal: She exhibits no tenderness.  Neurological: She is alert and oriented to person, place, and time.  Cranial Nerves II-XII grossly intact  Skin: Skin is warm and dry. No rash noted.  Psychiatric: She has a normal mood and affect.  Nursing note and vitals reviewed.   ED Course  Procedures (including critical care time) Labs Review Labs Reviewed  COMPREHENSIVE METABOLIC PANEL - Abnormal; Notable for the  following:    Glucose, Bld 107 (*)    All other components within normal limits  URINALYSIS, ROUTINE W REFLEX MICROSCOPIC (NOT AT [facility]) - Abnormal; Notable for the following:    Specific Gravity, Urine 1.004 (*)    All other components within normal limits  LIPASE, BLOOD  CBC  PREGNANCY, URINE    Imaging Review No results found. I have personally reviewed and evaluated these images and lab results as part of my medical decision-making.   EKG Interpretation None     Meds given in ED:  Medications  ketorolac (TORADOL) 30 MG/ML injection 30 mg (30 mg Intravenous Given 09/10/15 1246)    Discharge Medication List as of 09/10/2015  2:30 PM     Filed Vitals:   09/10/15 1221 09/10/15 1300 09/10/15 1400  BP: 143/86  121/70  Pulse: 77 73 66  Temp: 97.9 F (36.6 C)    TempSrc: Oral    Resp: 18  16  SpO2: 97% 98% 98%    MDM  Traci Oliver is a 50 y.o. female history of right upper quadrant abdominal pain over the past 2 weeks, worse today. On arrival, she is hemodynamically stable and afebrile and appears very well.  No Murphy sign on exam. Abdominal exam is otherwise unremarkable. No CVA tenderness or urinary symptoms. No peritoneal signs. Screening labs are unremarkable. No urinary infection or evidence of renal stone. Patient states she feels much better after administration of Toradol. Discussed follow-up with PCP in the next 2 days for reevaluation. Discussed strict return precautions. Prescription for Motrin given. No evidence of other acute or emergent pathology at this time. Final diagnoses:  Abdominal discomfort in right upper quadrant        Comer Locket, PA-C 09/10/15 1452  Julianne Rice, MD 09/13/15 1714

## 2015-09-10 NOTE — ED Notes (Addendum)
PO challenge given  

## 2015-09-10 NOTE — ED Notes (Signed)
Patient left at this time with all belongings. 

## 2015-11-30 ENCOUNTER — Ambulatory Visit
Admission: RE | Admit: 2015-11-30 | Discharge: 2015-11-30 | Disposition: A | Discharge status: Home / Self Care | Payer: 59 | Source: Ambulatory Visit | Attending: Nurse Practitioner | Admitting: Nurse Practitioner

## 2015-11-30 ENCOUNTER — Other Ambulatory Visit: Payer: Self-pay | Admitting: Nurse Practitioner

## 2015-11-30 DIAGNOSIS — R0781 Pleurodynia: Secondary | ICD-10-CM

## 2015-11-30 DIAGNOSIS — R0602 Shortness of breath: Secondary | ICD-10-CM

## 2016-02-06 ENCOUNTER — Other Ambulatory Visit: Payer: Self-pay | Admitting: Nurse Practitioner

## 2016-02-06 DIAGNOSIS — Z1231 Encounter for screening mammogram for malignant neoplasm of breast: Secondary | ICD-10-CM

## 2016-02-22 ENCOUNTER — Ambulatory Visit
Admission: RE | Admit: 2016-02-22 | Discharge: 2016-02-22 | Disposition: A | Discharge status: Home / Self Care | Payer: BLUE CROSS/BLUE SHIELD | Source: Ambulatory Visit | Attending: Nurse Practitioner | Admitting: Nurse Practitioner

## 2016-02-22 DIAGNOSIS — Z1231 Encounter for screening mammogram for malignant neoplasm of breast: Secondary | ICD-10-CM

## 2017-05-23 ENCOUNTER — Ambulatory Visit: Payer: BLUE CROSS/BLUE SHIELD | Admitting: Family Medicine

## 2017-05-30 ENCOUNTER — Other Ambulatory Visit: Payer: Self-pay | Admitting: Adult Health

## 2017-05-30 DIAGNOSIS — Z139 Encounter for screening, unspecified: Secondary | ICD-10-CM

## 2017-06-20 ENCOUNTER — Ambulatory Visit: Payer: Self-pay

## 2017-07-04 DIAGNOSIS — R32 Unspecified urinary incontinence: Secondary | ICD-10-CM | POA: Insufficient documentation

## 2017-07-10 ENCOUNTER — Ambulatory Visit: Payer: Self-pay

## 2017-07-31 ENCOUNTER — Ambulatory Visit
Admission: RE | Admit: 2017-07-31 | Discharge: 2017-07-31 | Disposition: A | Discharge status: Home / Self Care | Payer: PRIVATE HEALTH INSURANCE | Source: Ambulatory Visit | Attending: Adult Health | Admitting: Adult Health

## 2017-07-31 DIAGNOSIS — Z139 Encounter for screening, unspecified: Secondary | ICD-10-CM

## 2018-03-25 ENCOUNTER — Ambulatory Visit (HOSPITAL_COMMUNITY)
Admission: EM | Admit: 2018-03-25 | Discharge: 2018-03-25 | Disposition: A | Discharge status: Home / Self Care | Payer: BLUE CROSS/BLUE SHIELD | Attending: Family Medicine | Admitting: Family Medicine

## 2018-03-25 ENCOUNTER — Other Ambulatory Visit: Payer: Self-pay

## 2018-03-25 ENCOUNTER — Encounter (HOSPITAL_COMMUNITY): Payer: Self-pay | Admitting: Emergency Medicine

## 2018-03-25 ENCOUNTER — Ambulatory Visit (INDEPENDENT_AMBULATORY_CARE_PROVIDER_SITE_OTHER): Payer: BLUE CROSS/BLUE SHIELD

## 2018-03-25 DIAGNOSIS — R0781 Pleurodynia: Secondary | ICD-10-CM

## 2018-03-25 MED ORDER — NAPROXEN 500 MG PO TABS: 500.0000 mg | ORAL_TABLET | Freq: Two times a day (BID) | ORAL | 0 refills | Status: DC

## 2018-03-25 NOTE — ED Triage Notes (Signed)
Right epigastric pain around right side of torso.  Pain is deep inside body.  Tender to palpation in right epigastric area.   Early Monday morning , pain woke her.    Patient does recall her dog jumping on her, unsure of any injury.  No visible mark.

## 2018-03-25 NOTE — Discharge Instructions (Signed)
Use anti-inflammatories for pain/swelling. You may take up to 800 mg Ibuprofen every 8 hours  OR Naprosyn twice daily with food. You may supplement Ibuprofen with Tylenol [phone] mg every 8 hours.   Ice right side  Follow up if pain not improving, developing fever, nausea, vomiting, worsening pain, shortness of breath, chest pain

## 2018-03-25 NOTE — ED Provider Notes (Signed)
[place]    CSN: [id number] Arrival date & time: 03/25/18  2505     History   Chief Complaint No chief complaint on file.   HPI Traci Oliver is a 52 y.o. female history of previous tubal ligation presenting today for evaluation of right-sided pain.  Patient states that for the past 2 days she has had pain in her right side that wraps around her abdomen and back.  She denies any specific injury, but does recall a pitbull jumping on her prior to onset.  She also notes that she made a hot chili pepper meatloaf before symptoms began as well.  Pain is worsened with breathing, pressing on the area as well as with lying flat.  She has had occasional dizziness and nausea associated with the pain, but has not had persistent nausea.  Denies vomiting or diarrhea.  Denies worsening of symptoms with eating.  Denies recent coughing or URI symptoms.  Denies fevers.  She is not used any medicines for her symptoms.  Patient smokes a few cigarettes a day.  The pack will last her few weeks. Denies previous DVT/PE.  Denies shortness of breath.  HPI  History reviewed. No pertinent past medical history.  Patient Active Problem List   Diagnosis Date Noted  . Hot flashes, menopausal 02/03/2015  . Mood swings 02/03/2015  . Anxiety 02/03/2015  . Family history of ovarian cancer 02/03/2015  . PLANTAR FASCIITIS, BILATERAL 07/13/2010  . VENTRAL HERNIA 11/24/2008  . ECZEMA 11/24/2008  . INCONTINENCE, URGE 11/24/2008  . DENTAL PAIN 04/17/2007  . PANIC ATTACK 12/31/2006  . CIGARETTE SMOKER 12/31/2006  . PAP SMEAR, ABNORMAL 12/31/2006    Past Surgical History:  Procedure Laterality Date  . CESAREAN SECTION    . TUBAL LIGATION      OB History    Gravida  2   Para  2   Term  1   Preterm  1   AB      Living  3     SAB      TAB      Ectopic      Multiple  1   Live Births               Home Medications    Prior to Admission medications   Medication Sig Start  Date End Date Taking? Authorizing Provider  FLUoxetine (PROZAC) 20 MG capsule Take 20 mg by mouth every morning. 08/11/15  Yes [provider]  clonazePAM (KLONOPIN) 0.5 MG tablet Take 0.25-0.5 mg by mouth 2 (two) times daily as needed for anxiety.  09/09/15   [provider]  ibuprofen (ADVIL,MOTRIN) 600 MG tablet Take 1 tablet (600 mg total) by mouth every 6 (six) hours as needed. 09/10/15   Cartner, Marland Kitchen, PA-C  naproxen (NAPROSYN) 500 MG tablet Take 1 tablet (500 mg total) by mouth 2 (two) times daily. 03/25/18   Starlin Steib C, PA-C  traZODone (DESYREL) 50 MG tablet Take 1 tablet (50 mg total) by mouth at bedtime. Patient taking differently: Take 50 mg by mouth at bedtime as needed for sleep.  02/01/15   Leftwich-Kirby, Kathie Dike, CNM    Family History Family History  Problem Relation Age of Onset  . Cancer Mother        ovarian  . Heart attack Father   . Heart disease Father   . Diabetes Maternal Grandmother   . Cancer Maternal Grandmother        lung  . Hypertension  Maternal Grandmother   . Cancer Maternal Grandfather        lung    Social History Social History   Tobacco Use  . Smoking status: Current Every Day Smoker  . Smokeless tobacco: Never Used  Substance Use Topics  . Alcohol use: Yes    Alcohol/week: 0.0 standard drinks  . Drug use: No     Allergies   Patient has no known allergies.   Review of Systems Review of Systems  Constitutional: Negative for activity change, appetite change, chills, fatigue and fever.  HENT: Negative for congestion, ear pain, rhinorrhea, sinus pressure, sore throat and trouble swallowing.   Eyes: Negative for discharge and redness.  Respiratory: Negative for cough, chest tightness and shortness of breath.   Cardiovascular: Negative for chest pain.  Gastrointestinal: Positive for abdominal pain. Negative for diarrhea, nausea and vomiting.  Genitourinary: Positive for flank pain. Negative for dysuria, hematuria and  urgency.  Musculoskeletal: Positive for myalgias. Negative for back pain and neck pain.  Skin: Negative for rash.  Neurological: Negative for dizziness, light-headedness and headaches.     Physical Exam Triage Vital Signs ED Triage Vitals  Enc Vitals Group     BP 03/25/18 0852 (!) 141/92     Pulse Rate 03/25/18 0852 87     Resp 03/25/18 0852 16     Temp 03/25/18 0852 98.1 F (36.7 C)     Temp Source 03/25/18 0852 Oral     SpO2 03/25/18 0852 99 %     Weight --      Height --      Head Circumference --      Peak Flow --      Pain Score 03/25/18 0905 8     Pain Loc --      Pain Edu? --      Excl. in [place]? --    No data found.  Updated Vital Signs BP (!) 141/92 (BP Location: Left Arm)   Pulse 87   Temp 98.1 F (36.7 C) (Oral)   Resp 16   SpO2 99%   Visual Acuity Right Eye Distance:   Left Eye Distance:   Bilateral Distance:    Right Eye Near:   Left Eye Near:    Bilateral Near:     Physical Exam  Constitutional: She appears well-developed and well-nourished. No distress.  HENT:  Head: Normocephalic and atraumatic.  Oral mucosa pink and moist, no tonsillar enlargement or exudate. Posterior pharynx patent and nonerythematous, no uvula deviation or swelling. Normal phonation.  Eyes: Pupils are equal, round, and reactive to light. Conjunctivae and EOM are normal.  Neck: Neck supple.  Cardiovascular: Normal rate and regular rhythm.  No murmur heard. Pulmonary/Chest: Effort normal and breath sounds normal. No respiratory distress. She exhibits tenderness.  Breathing comfortably at rest, CTABL, no wheezing, rales or other adventitious sounds auscultated  Tenderness to palpation of right flank/lower ribs extending through right lower breast into epigastrium  Abdominal: Soft. There is tenderness.  Mild tenderness to palpation of epigastrium, negative rebound, negative Murphy's  Musculoskeletal: She exhibits no edema.  Nontender to palpation of cervical, thoracic spine  midline, mild tenderness to far lateral lower thoracic musculature  Neurological: She is alert.  Skin: Skin is warm and dry.  Psychiatric: She has a normal mood and affect.  Nursing note and vitals reviewed.    UC Treatments / Results  Labs (all labs ordered are listed, but only abnormal results are displayed) Labs Reviewed - No data to display  EKG None  Radiology Dg Ribs Unilateral W/chest Right  Result Date: 03/25/2018 CLINICAL DATA:  Acute right rib pain without known injury. EXAM: RIGHT RIBS AND CHEST - 3+ VIEW COMPARISON:  Radiographs of November 30, 2015. FINDINGS: No fracture or other bone lesions are seen involving the ribs. There is no evidence of pneumothorax or pleural effusion. Both lungs are clear. Heart size and mediastinal contours are within normal limits. IMPRESSION: Negative. Electronically Signed   By: Marijo Conception, M.D.   On: 03/25/2018 09:46    Procedures Procedures (including critical care time)  Medications Ordered in UC Medications - No data to display  Initial Impression / Assessment and Plan / UC Course  I have reviewed the triage vital signs and the nursing notes.  Pertinent labs & imaging results that were available during my care of the patient were reviewed by me and considered in my medical decision making (see chart for details).     X-ray negative.  Symptoms seem unrelated to eating, less likely gallbladder pathology.  Will treat as musculoskeletal etiology at this time with anti-inflammatories, ice.  Less likely PE given reproducible with palpation, had to 100%, heart rate 87..  Will have patient continue to monitor symptoms and follow-up if developing worsening pain, shortness of breath, chest pain, difficulty breathing, fever, abdominal pain, nausea or vomiting.Discussed strict return precautions. Patient verbalized understanding and is agreeable with plan.  Final Clinical Impressions(s) / UC Diagnoses   Final diagnoses:  Rib pain on right  side     Discharge Instructions     Use anti-inflammatories for pain/swelling. You may take up to 800 mg Ibuprofen every 8 hours  OR Naprosyn twice daily with food. You may supplement Ibuprofen with Tylenol [phone] mg every 8 hours.   Ice right side  Follow up if pain not improving, developing fever, nausea, vomiting, worsening pain, shortness of breath, chest pain   ED Prescriptions    Medication Sig Dispense Auth. Provider   naproxen (NAPROSYN) 500 MG tablet Take 1 tablet (500 mg total) by mouth 2 (two) times daily. 30 tablet Kourtnei Rauber, [place] C, PA-C     Controlled Substance Prescriptions [place] Controlled Substance Registry consulted? Not Applicable   Janith Lima, [place] 03/25/18 1022

## 2019-03-15 IMAGING — DX DG RIBS W/ CHEST 3+V*R*
3 series · 3 of 3 positions shown · non-contrast
Comparison: Radiographs November 30, 2015.

CLINICAL DATA: Acute right rib pain without known injury.

EXAM:
RIGHT RIBS AND CHEST - 3+ VIEW

[chest pa]
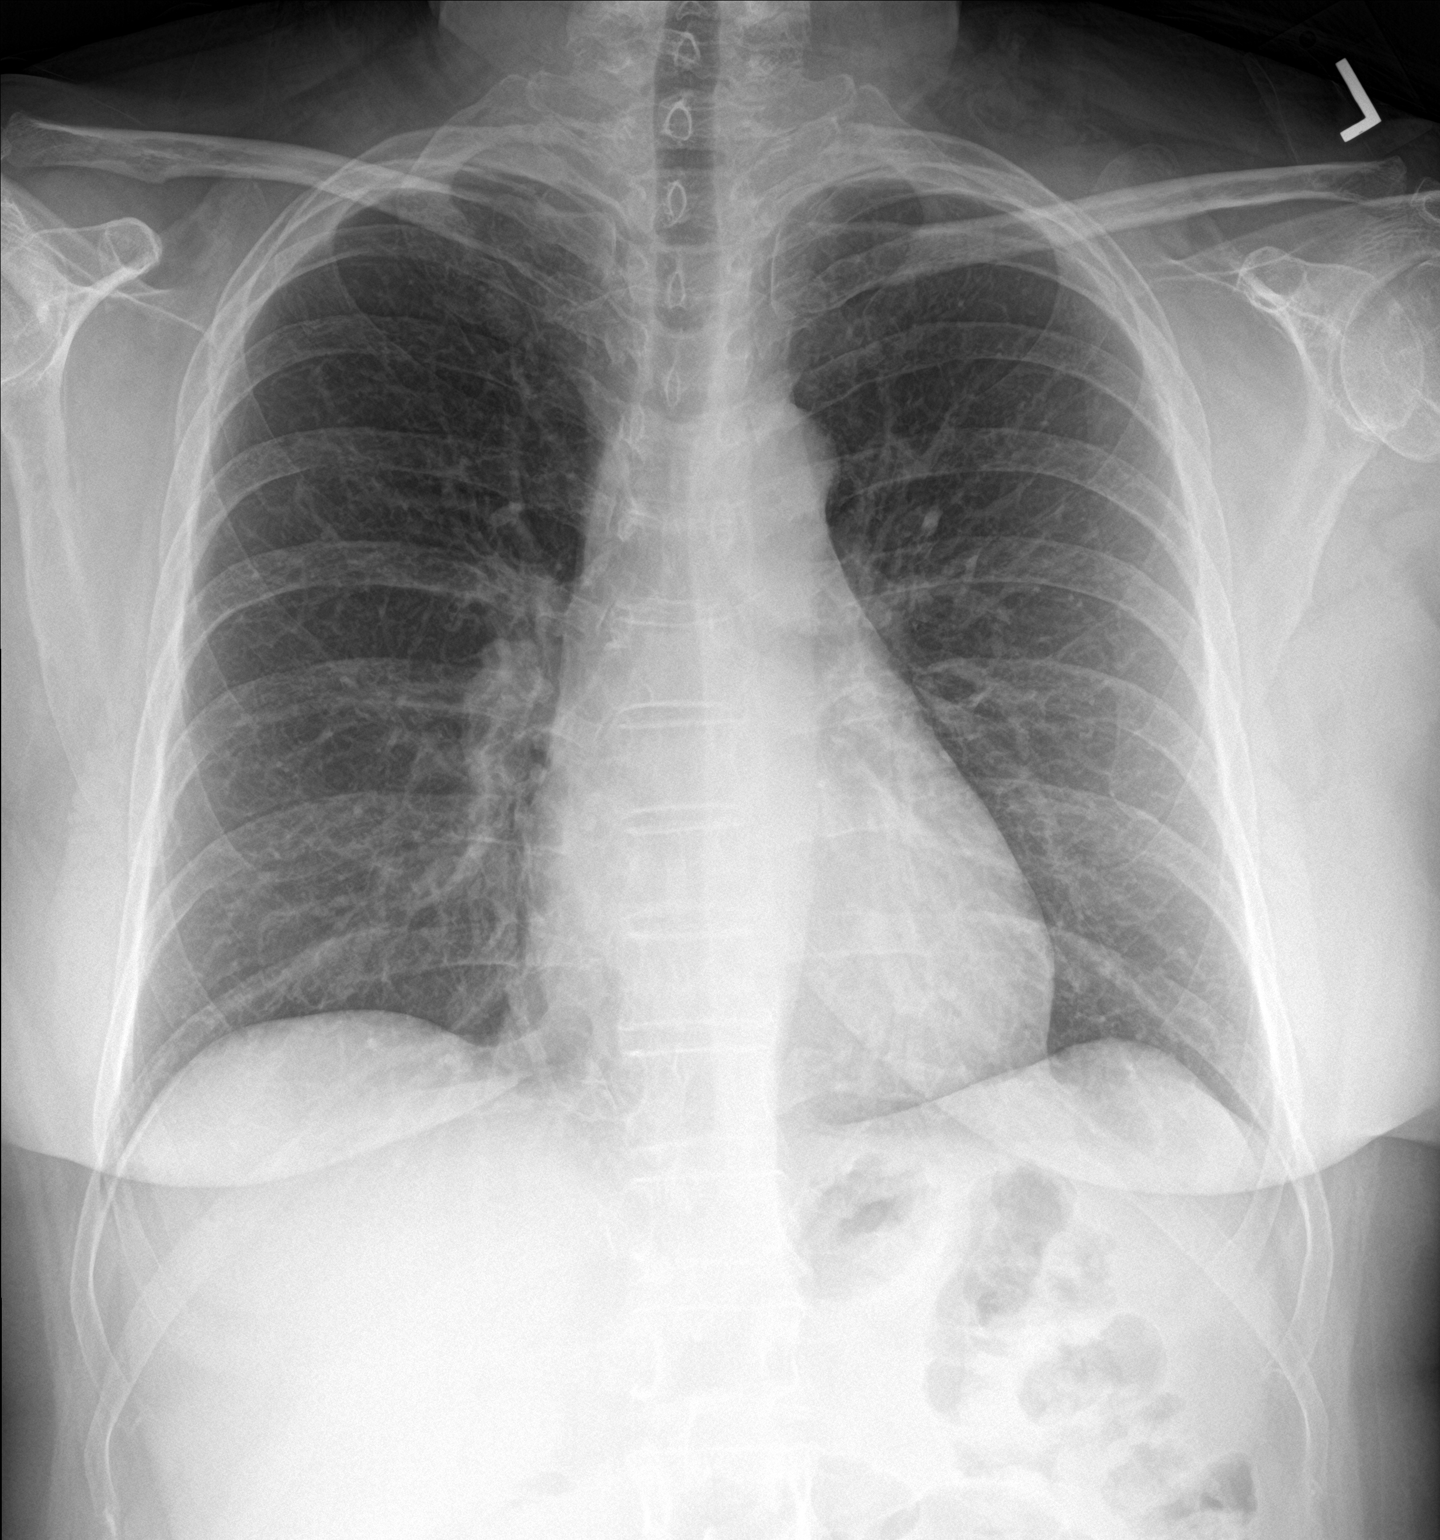

[rib pa]
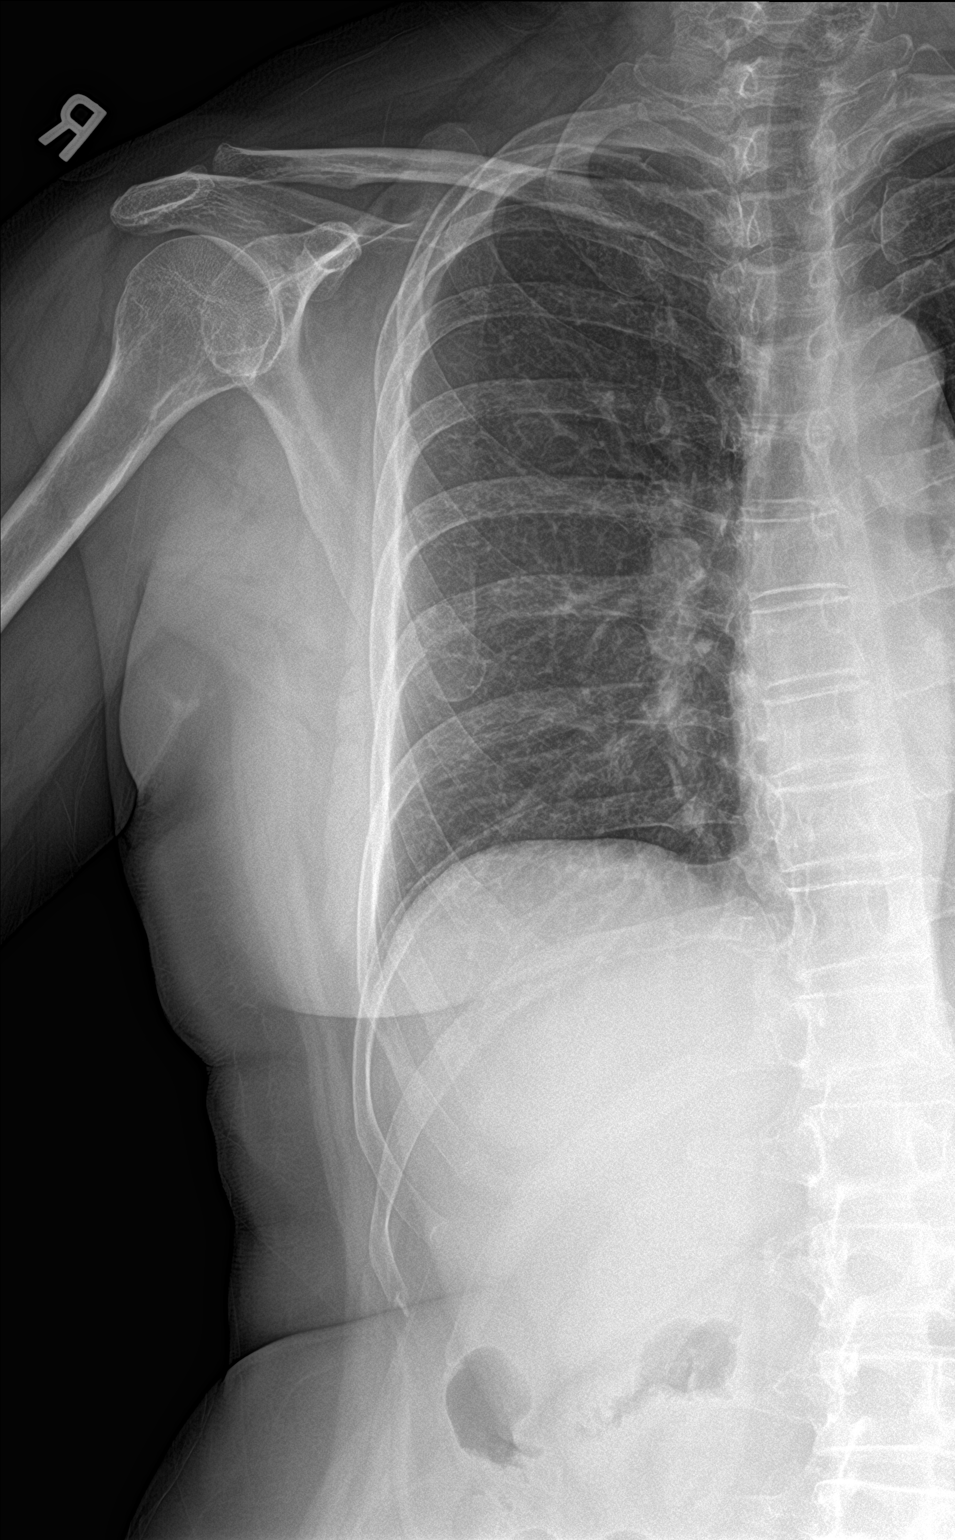

[rib obl]
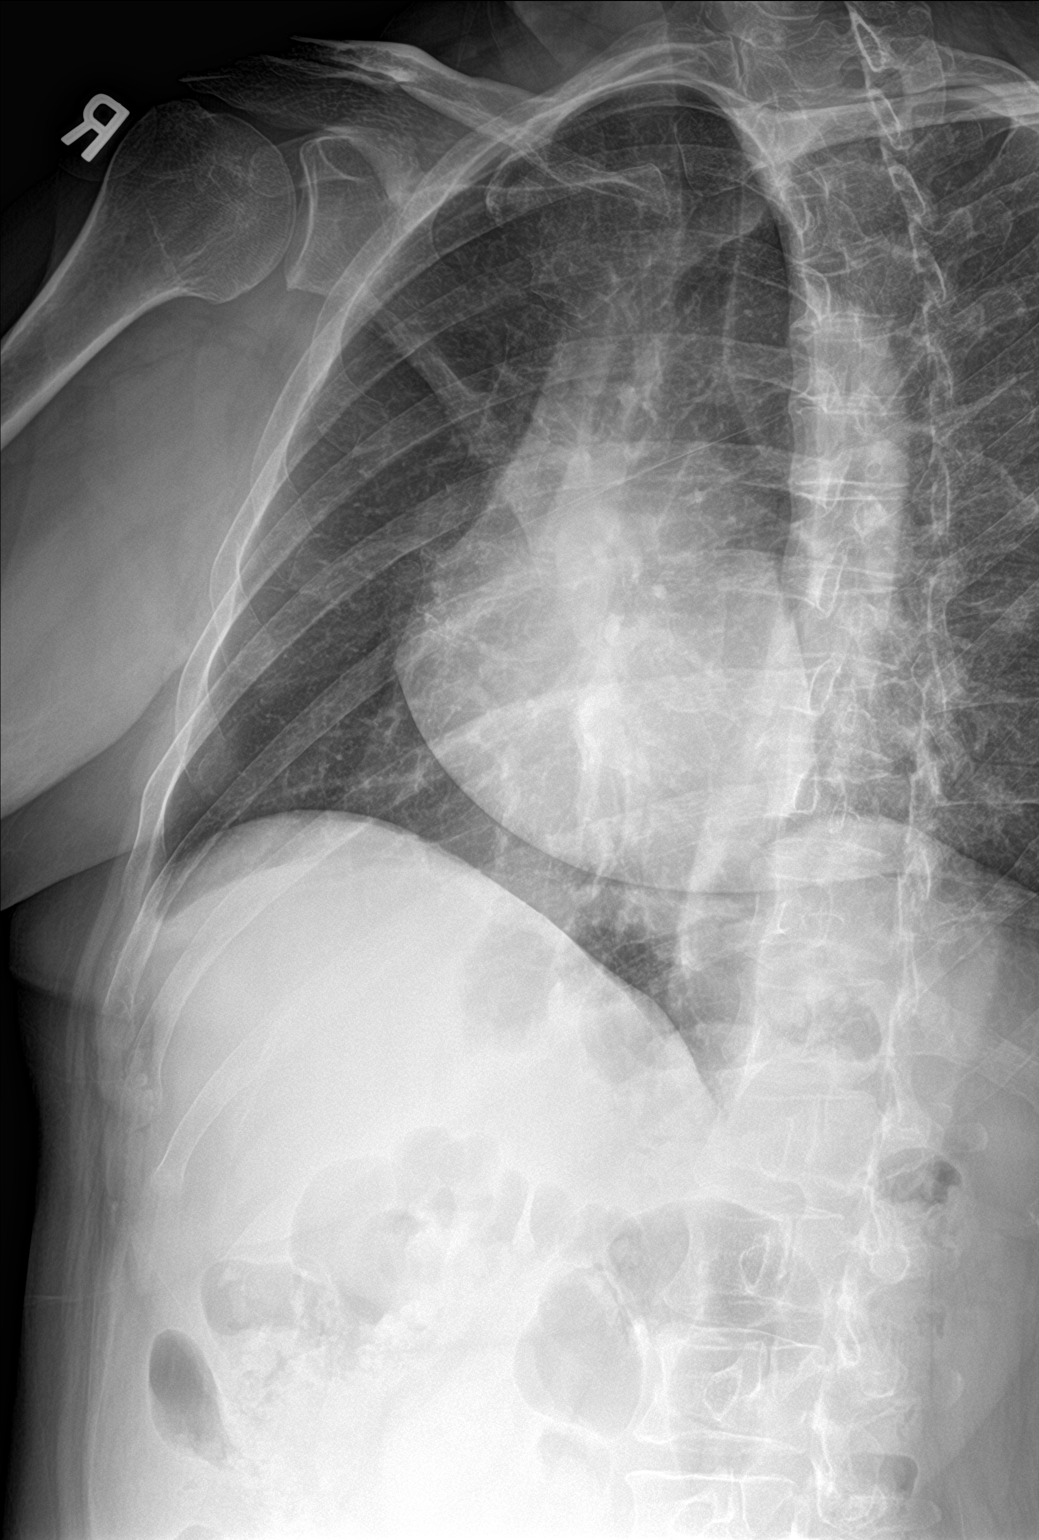

[3 of 3 positions shown; findings below may reference images not displayed]

FINDINGS: No fracture or other bone lesions are seen involving the ribs. There
is no evidence of pneumothorax or pleural effusion. Both lungs are
clear. Heart size and mediastinal contours are within normal limits.
IMPRESSION: Negative.

## 2019-09-22 ENCOUNTER — Encounter: Payer: Self-pay | Admitting: Family Medicine

## 2019-09-22 ENCOUNTER — Other Ambulatory Visit: Payer: Self-pay

## 2019-09-22 ENCOUNTER — Ambulatory Visit (INDEPENDENT_AMBULATORY_CARE_PROVIDER_SITE_OTHER): Payer: 59 | Admitting: Family Medicine

## 2019-09-22 VITALS — BP 142/98 | HR 77 | Temp 97.7°F | Ht 63.0 in | Wt 172.8 lb

## 2019-09-22 DIAGNOSIS — M722 Plantar fascial fibromatosis: Secondary | ICD-10-CM | POA: Diagnosis not present

## 2019-09-22 DIAGNOSIS — Z1211 Encounter for screening for malignant neoplasm of colon: Secondary | ICD-10-CM | POA: Diagnosis not present

## 2019-09-22 DIAGNOSIS — F17210 Nicotine dependence, cigarettes, uncomplicated: Secondary | ICD-10-CM

## 2019-09-22 DIAGNOSIS — F32A Depression, unspecified: Secondary | ICD-10-CM

## 2019-09-22 DIAGNOSIS — F419 Anxiety disorder, unspecified: Secondary | ICD-10-CM | POA: Diagnosis not present

## 2019-09-22 DIAGNOSIS — Z7689 Persons encountering health services in other specified circumstances: Secondary | ICD-10-CM

## 2019-09-22 DIAGNOSIS — N393 Stress incontinence (female) (male): Secondary | ICD-10-CM

## 2019-09-22 DIAGNOSIS — F5104 Psychophysiologic insomnia: Secondary | ICD-10-CM

## 2019-09-22 DIAGNOSIS — F329 Major depressive disorder, single episode, unspecified: Secondary | ICD-10-CM

## 2019-09-22 MED ORDER — TRAZODONE HCL 50 MG PO TABS: 50.0000 mg | ORAL_TABLET | Freq: Every evening | ORAL | 0 refills | Status: DC | PRN

## 2019-09-22 MED ORDER — NAPROXEN 500 MG PO TABS: 500.0000 mg | ORAL_TABLET | Freq: Two times a day (BID) | ORAL | 0 refills | Status: DC | PRN

## 2019-09-22 MED ORDER — FLUOXETINE HCL 20 MG PO CAPS: 20.0000 mg | ORAL_CAPSULE | Freq: Every morning | ORAL | 3 refills | Status: DC

## 2019-09-22 MED ORDER — CLONAZEPAM 0.5 MG PO TABS: 0.2500 mg | ORAL_TABLET | Freq: Every day | ORAL | 0 refills | Status: DC | PRN

## 2019-09-22 NOTE — Patient Instructions (Addendum)
Please call and schedule an appointment for screening mammogram. A referral is not needed.  [place]  Follow up for your complete physical in September  A resource that I like is www.dietdoctor.com  Here are some guidelines to help you with meal planning -  Avoid all processed and packaged foods (bread, pasta, crackers, chips, etc) and beverages containing calories.  Avoid added sugars and excessive natural sugars.  Attention to how you feel if you consume artificial sweeteners.  Do they make you more hungry or raise your blood sugar?  With every meal and snack, aim to get 20 g of protein (3 ounces of meat, 4 ounces of fish, 3 eggs, protein powder, 1 cup [place] yogurt, 1 cup cottage cheese, etc.)  Increase fiber in the form of non-starchy vegetables.  These help you feel full with very little carbohydrates and are good for gut health.  Eat 1 serving healthy carb per meal- 1/2 cup brown rice, beans, potato, corn- pay attention to whether or not this significantly raises your blood sugar. If it does, reduce the frequency you consume these.   Eat 2-3 servings of lower sugar fruits daily.  This includes berries, apples, oranges, peaches, pears, one half banana.  Have small amounts of good fats such as avocado, nuts, olive oil, nut butters, olives.  Add a little cheese to your salads to make them tasty.     Kegel Exercises  Kegel exercises can help strengthen your pelvic floor muscles. The pelvic floor is a group of muscles that support your rectum, small intestine, and bladder. In females, pelvic floor muscles also help support the womb (uterus). These muscles help you control the flow of urine and stool. Kegel exercises are painless and simple, and they do not require any equipment. Your provider may suggest Kegel exercises to:  Improve bladder and bowel control.  Improve sexual response.  Improve weak pelvic floor muscles after surgery to remove  the uterus (hysterectomy) or pregnancy (females).  Improve weak pelvic floor muscles after prostate gland removal or surgery (males). Kegel exercises involve squeezing your pelvic floor muscles, which are the same muscles you squeeze when you try to stop the flow of urine or keep from passing gas. The exercises can be done while sitting, standing, or lying down, but it is best to vary your position. Exercises How to do Kegel exercises: 1. Squeeze your pelvic floor muscles tight. You should feel a tight lift in your rectal area. If you are a female, you should also feel a tightness in your vaginal area. Keep your stomach, buttocks, and legs relaxed. 2. Hold the muscles tight for up to 10 seconds. 3. Breathe normally. 4. Relax your muscles. 5. Repeat as told by your health care provider. Repeat this exercise daily as told by your health care provider. Continue to do this exercise for at least 4-6 weeks, or for as long as told by your health care provider. You may be referred to a physical therapist who can help you learn more about how to do Kegel exercises. Depending on your condition, your health care provider may recommend:  Varying how long you squeeze your muscles.  Doing several sets of exercises every day.  Doing exercises for several weeks.  Making Kegel exercises a part of your regular exercise routine. This information is not intended to replace advice given to you by your health care provider. Make sure you discuss any questions you have with your health care provider. Document Revised:  12/10/2017 Document Reviewed: 12/10/2017 Elsevier Patient Education  [place].

## 2019-09-22 NOTE — Progress Notes (Signed)
Subjective:    Patient ID: Traci Oliver, female    DOB: 12/21/1965, 54 y.o.   MRN: [id number]  HPI Chief Complaint  Patient presents with  . Medication Refill    Naproxen, Prozac, Klonopin, Trazodone    This is a 54 yo female who presents today for above cc. She is a Training and development officer at [employer]. Lives by herself. Enjoys her grandchildren x 3.   Last CPE- 01/26/19 Mammo- 07/31/17 Pap-01/26/19- negative/ negative HPV Colonoscopy- never Tdap- ?,  She thinks she has had within the last couple of years Flu- annual Eye- in last year Dental- regular Exercise- not regular Tobacco abuse-smokes 1 pack of cigarettes every 4 to 6 weeks  Depression and anxiety- has been on fluoxetine 20 mg for a long time. Takes clonazepam 2-3 times a month. Has not had filled in several years. Takes trazodone most nights. Gets 6-7 hours sleep nightly.   Plantar fascitits- for 10 years. Takes naproxen 500 mg 1-2 x/ week with good results. Takes ibuprofen 2-3 times a week, not with naproxen. Some stretching. Significant improvement with different shoes.   GERD- takes ? famotidine daily with good relief, some food triggers- hot foods, stress  Stress incontinence- with coughing, laughing, was put on oxybutinin with no improvement.  Has not been doing any pelvic floor exercises.  Obesity- eats late, eating 2 meals a day. Not healthy. Breads. No soda, sweet tea or juice.   Review of Systems No chest pain, SOB, nausea, vomiting, dysuria, hematuria, blood in stools, + foot pain    Objective:   Physical Exam Vitals reviewed.  Constitutional:      General: She is not in acute distress.    Appearance: Normal appearance. She is obese. She is not ill-appearing, toxic-appearing or diaphoretic.  HENT:     Head: Normocephalic and atraumatic.  Eyes:     Conjunctiva/sclera: Conjunctivae normal.  Cardiovascular:     Rate and Rhythm: Normal rate.  Pulmonary:     Effort: Pulmonary effort is normal.  Neurological:      Mental Status: She is alert and oriented to person, place, and time.  Psychiatric:        Mood and Affect: Mood normal.        Behavior: Behavior normal.        Thought Content: Thought content normal.        Judgment: Judgment normal.       BP (!) 142/98 (BP Location: Left Arm, Patient Position: Sitting, Cuff Size: Normal)   Pulse 77   Temp 97.7 F (36.5 C) (Temporal)   Ht 5\' 3"  (1.6 m)   Wt 172 lb 12.8 oz (78.4 kg)   SpO2 97%   BMI 30.61 kg/m      Assessment & Plan:  1. Encounter to establish care -Reviewed available electronic medical records, reviewed overdue health maintenance and provided information to schedule screening mammogram, referral placed for screening colonoscopy  2. Screening for colon cancer - Ambulatory referral to Gastroenterology  3. Anxiety and depression -Patient feels that she is well controlled on fluoxetine 20 mg.  She reports very rare use of clonazepam in the past.  Review of controlled substance database does not find any prescriptions of clonazepam filled in the last 2 years.  Discussed rare use and will provide small amount. - FLUoxetine (PROZAC) 20 MG capsule; Take 1 capsule (20 mg total) by mouth every morning.  Dispense: 90 capsule; Refill: 3 - clonazePAM (KLONOPIN) 0.5 MG tablet; Take 0.5-1 tablets (0.25-0.5 mg  total) by mouth daily as needed for anxiety.  Dispense: 20 tablet; Refill: 0  4. PLANTAR FASCIITIS, BILATERAL -Advised her to resume her stretching and ice as needed after working.  Advised her to avoid excessive use of NSAIDs including Naprosyn and ibuprofen. - naproxen (NAPROSYN) 500 MG tablet; Take 1 tablet (500 mg total) by mouth 2 (two) times daily as needed.  Dispense: 60 tablet; Refill: 0  5. Psychophysiological insomnia -Doing well with as needed trazodone - traZODone (DESYREL) 50 MG tablet; Take 1 tablet (50 mg total) by mouth at bedtime as needed for sleep.  Dispense: 90 tablet; Refill: 0  6. Stress incontinence of  urine -No improvement with oxybutynin 5 mg extended release, discussed stopping this and encouraged her to do pelvic floor exercises and provided written information regarding Kegel exercises.  May benefit from pelvic floor therapy.  7. Light tobacco smoker <10 cigarettes per day -Advised her that smoking cessation entirely would be in her best interest  This visit occurred during the SARS-CoV-2 public health emergency.  Safety protocols were in place, including screening questions prior to the visit, additional usage of staff PPE, and extensive cleaning of exam room while observing appropriate contact time as indicated for disinfecting solutions.   -Follow-up in September for complete physical and labs   Clarene Reamer, FNP-BC  [facility], [place]  09/22/2019 10:34 AM

## 2019-10-14 ENCOUNTER — Other Ambulatory Visit: Payer: Self-pay | Admitting: Family Medicine

## 2019-10-14 DIAGNOSIS — M722 Plantar fascial fibromatosis: Secondary | ICD-10-CM

## 2019-10-14 NOTE — Telephone Encounter (Signed)
Last OV 09/22/19 Upcoming OV 01/17/20 for CPE  Please advise, thanks.

## 2019-10-25 ENCOUNTER — Ambulatory Visit: Payer: BLUE CROSS/BLUE SHIELD | Admitting: Family Medicine

## 2019-11-19 ENCOUNTER — Encounter: Payer: Self-pay | Admitting: Gastroenterology

## 2019-12-13 ENCOUNTER — Other Ambulatory Visit: Payer: Self-pay | Admitting: Family Medicine

## 2019-12-13 DIAGNOSIS — F5104 Psychophysiologic insomnia: Secondary | ICD-10-CM

## 2019-12-13 NOTE — Telephone Encounter (Signed)
Can this patient receive a refill?   Last office visit- 09-22-2019 Next office visit- 01-17-2020

## 2019-12-14 ENCOUNTER — Ambulatory Visit: Payer: 59 | Admitting: Family Medicine

## 2019-12-15 ENCOUNTER — Ambulatory Visit (INDEPENDENT_AMBULATORY_CARE_PROVIDER_SITE_OTHER): Payer: 59 | Admitting: Family Medicine

## 2019-12-15 ENCOUNTER — Other Ambulatory Visit: Payer: Self-pay

## 2019-12-15 ENCOUNTER — Encounter: Payer: Self-pay | Admitting: Family Medicine

## 2019-12-15 VITALS — BP 122/80 | HR 88 | Ht 63.5 in | Wt 173.0 lb

## 2019-12-15 DIAGNOSIS — T07XXXA Unspecified multiple injuries, initial encounter: Secondary | ICD-10-CM | POA: Diagnosis not present

## 2019-12-15 DIAGNOSIS — W19XXXA Unspecified fall, initial encounter: Secondary | ICD-10-CM

## 2019-12-15 NOTE — Progress Notes (Signed)
Subjective:    Patient ID: Traci Oliver, female    DOB: 1965/10/09, 55 y.o.   MRN: [id number]  HPI Chief Complaint  Patient presents with  . new problem    Pt stated--fell and hit right side of the ribs/ lower breast--painful, bruise-4 days   This is a 54 yo female who fell getting into her pool 5 days ago. Her pool ladder was broken so she attempted to jump from the deck of the pool into the water. There was a gap between the pool deck in the pool into which she slipped. She landed on her right ribcage, left groin, struck forehead over left eye, had nose bleed. Taking naproxen 500 mg twice a day and alternating with acetaminophen. Improvement with heat. Headache initially which has resolved. Has missed work the last 3 days. Did not lose consciousness. Feels a little better today.    Review of Systems Per HPI    Objective:   Physical Exam Vitals reviewed.  Constitutional:      General: She is not in acute distress.    Appearance: Normal appearance. She is obese. She is not ill-appearing, toxic-appearing or diaphoretic.  HENT:     Head: Normocephalic.     Comments: Faint bruise over her left eye. No swelling. Eyes:     Conjunctiva/sclera: Conjunctivae normal.  Cardiovascular:     Rate and Rhythm: Normal rate.  Pulmonary:     Effort: Pulmonary effort is normal.  Musculoskeletal:        General: Tenderness present.     Cervical back: Normal range of motion and neck supple.     Right lower leg: No edema.     Left lower leg: No edema.     Comments: Bruising and tenderness along right lateral rib cage. Bruising of right buttock, left inner thigh and left lower leg. Few small abrasions. No erythema or drainage. Slightly stilted gait. Able to rise from seated position and get on exam table.  Skin:    General: Skin is warm and dry.     Findings: Bruising present.  Neurological:     Mental Status: She is alert and oriented to person, place, and time.  Psychiatric:        Mood  and Affect: Mood normal.        Behavior: Behavior normal.        Thought Content: Thought content normal.        Judgment: Judgment normal.          BP 122/80   Pulse 88   Ht 5' 3.5" (1.613 m)   Wt 173 lb (78.5 kg)   SpO2 97%   BMI 30.16 kg/m  Wt Readings from Last 3 Encounters:  12/15/19 173 lb (78.5 kg)  09/22/19 172 lb 12.8 oz (78.4 kg)  02/01/15 151 lb (68.5 kg)    Assessment & Plan:  1. Fall, initial encounter - no LOC, no residual headache -Discussed safety and encouraged her to avoid getting in pool until no ladder is installed  2. Multiple contusions - Provided written and verbal information regarding diagnosis and treatment. -Out of work note provided  This visit occurred during the SARS-CoV-2 public health emergency.  Safety protocols were in place, including screening questions prior to the visit, additional usage of staff PPE, and extensive cleaning of exam room while observing appropriate contact time as indicated for disinfecting solutions.      Clarene Reamer, FNP-BC  [facility]  12/16/2019 11:24 AM

## 2019-12-15 NOTE — Patient Instructions (Signed)
Good to see you today  I think you have contusion of your right ribs, left leg, right leg  Continue medication like you are doing, can use heat pads, lidocaine pads (over the counter)  Rib Contusion A rib contusion is a deep bruise on your rib area. Contusions are the result of a blunt trauma that causes bleeding and injury to the tissues under the skin. A rib contusion may involve bruising of the ribs and of the skin and muscles in the area. The skin over the contusion may turn blue, purple, or yellow. Minor injuries will give you a painless contusion. More severe contusions may stay painful and swollen for a few weeks. What are the causes? This condition is usually caused by a blow, trauma, or direct force to an area of the body. This often occurs while playing contact sports. What are the signs or symptoms? Symptoms of this condition include:  Swelling and redness of the injured area.  Discoloration of the injured area.  Tenderness and soreness of the injured area.  Pain with or without movement. How is this diagnosed? This condition may be diagnosed based on:  Your symptoms and medical history.  A physical exam.  Imaging tests--such as an X-ray, CT scan, or MRI--to determine if there were internal injuries or broken bones (fractures). How is this treated? This condition may be treated with:  Rest. This is often the best treatment for a rib contusion.  Icing. This reduces swelling and inflammation.  Deep-breathing exercises. These may be recommended to reduce the risk for lung collapse and pneumonia.  Medicines. Over-the-counter or prescription medicines may be given to control pain.  Injection of a numbing medicine around the nerve near your injury (nerve block). Follow these instructions at home:     Medicines  Take over-the-counter and prescription medicines only as told by your health care provider.  Do not drive or use heavy machinery while taking prescription  pain medicine.  If you are taking prescription pain medicine, take actions to prevent or treat constipation. Your health care provider may recommend that you: ? Drink enough fluid to keep your urine pale yellow. ? Eat foods that are high in fiber, such as fresh fruits and vegetables, whole grains, and beans. ? Limit foods that are high in fat and processed sugars, such as fried or sweet foods. ? Take an over-the-counter or prescription medicine for constipation. Managing pain, stiffness, and swelling  If directed, put ice on the injured area: ? Put ice in a plastic bag. ? Place a towel between your skin and the bag. ? Leave the ice on for 20 minutes, 2-3 times a day.  Rest the injured area. Avoid strenuous activity and any activities or movements that cause pain. Be careful during activities and avoid bumping the injured area.  Do not lift anything that is heavier than 5 lb (2.3 kg), or the limit that you are told, until your health care provider says that it is safe. General instructions  Do not use any products that contain nicotine or tobacco, such as cigarettes and e-cigarettes. These can delay healing. If you need help quitting, ask your health care provider.  Do deep-breathing exercises as told by your health care provider.  If you were given an incentive spirometer, use it every 1-2 hours while you are awake, or as recommended by your health care provider. This device measures how well you are filling your lungs with each breath.  Keep all follow-up visits as told by  your health care provider. This is important. Contact a health care provider if you have:  Increased bruising or swelling.  Pain that is not controlled with treatment.  A fever. Get help right away if you:  Have difficulty breathing or shortness of breath.  Develop a continual cough or you cough up thick or bloody sputum.  Feel nauseous or you vomit.  Have pain in your abdomen. Summary  A rib contusion  is a deep bruise on your rib area. Contusions are the result of a blunt trauma that causes bleeding and injury to the tissues under the skin.  The skin overlying the contusion may turn blue, purple, or yellow. Minor injuries may give you a painless contusion. More severe contusions may stay painful and swollen for a few weeks.  Rest the injured area. Avoid strenuous activity and any activities or movements that cause pain. This information is not intended to replace advice given to you by your health care provider. Make sure you discuss any questions you have with your health care provider. Document Revised: 05/21/2017 Document Reviewed: 05/21/2017 Elsevier Patient Education  2020 [employer].

## 2019-12-16 ENCOUNTER — Telehealth: Payer: Self-pay | Admitting: Family Medicine

## 2019-12-16 ENCOUNTER — Encounter: Payer: Self-pay | Admitting: Family Medicine

## 2019-12-16 NOTE — Telephone Encounter (Signed)
Patient was seen on 12/15/2019 for rib pain. Patient stated that she could not make it to work as she though she could yesterday. Patient stated that she must have over did and having a lot of pain to be able to go to work.  Patient is requesting if Jackelyn Poling is willing to write a work note to excuse her from work for the rest of this week and to return on Monday.  Please advise.

## 2019-12-16 NOTE — Telephone Encounter (Signed)
OK to write her a work note out for the rest of the week.

## 2019-12-17 ENCOUNTER — Encounter: Payer: Self-pay | Admitting: *Deleted

## 2019-12-17 NOTE — Telephone Encounter (Signed)
Patient called in asking that work note be emailed to her. Please advise.

## 2019-12-17 NOTE — Telephone Encounter (Signed)
Notified pt the note/letter is ready to pick up front and will sent it through My Chart.

## 2019-12-20 ENCOUNTER — Encounter: Payer: Self-pay | Admitting: Family Medicine

## 2019-12-20 ENCOUNTER — Telehealth: Payer: Self-pay | Admitting: *Deleted

## 2019-12-20 NOTE — Telephone Encounter (Signed)
Patient called.  Patient was unable to go to work today due to the pain.  Patient said she'll attempt to go to work tomorrow.  Patient would like a new letter showing her out of work through today. Please call patient when letter is completed.

## 2019-12-20 NOTE — Telephone Encounter (Signed)
Work note is at front. If she is unable to go to work tomorrow, she needs to follow up in the office.

## 2019-12-20 NOTE — Telephone Encounter (Signed)
Patient left a voicemail stating that she was not able to go to work today and would like another day added to her work note. Patient wants a new note with today added to it. Patient requested a call back when it is ready to be picked up and would like to get it today.

## 2019-12-20 NOTE — Telephone Encounter (Signed)
Please call patient and let her know that I have printed work note. If she is still having severe pain and is unable to work, she needs follow up.

## 2019-12-20 NOTE — Telephone Encounter (Signed)
Spoken and notified patient of Debbie's comments. Verbalized understanding.

## 2019-12-20 NOTE — Telephone Encounter (Signed)
Please advise 

## 2020-01-11 ENCOUNTER — Encounter: Payer: Self-pay | Admitting: Family Medicine

## 2020-01-11 ENCOUNTER — Other Ambulatory Visit: Payer: Self-pay | Admitting: Family Medicine

## 2020-01-11 ENCOUNTER — Other Ambulatory Visit: Payer: 59

## 2020-01-11 DIAGNOSIS — E6609 Other obesity due to excess calories: Secondary | ICD-10-CM

## 2020-01-11 DIAGNOSIS — Z6830 Body mass index (BMI) 30.0-30.9, adult: Secondary | ICD-10-CM

## 2020-01-12 ENCOUNTER — Other Ambulatory Visit (INDEPENDENT_AMBULATORY_CARE_PROVIDER_SITE_OTHER): Payer: 59

## 2020-01-12 ENCOUNTER — Other Ambulatory Visit: Payer: Self-pay

## 2020-01-12 DIAGNOSIS — E6609 Other obesity due to excess calories: Secondary | ICD-10-CM | POA: Diagnosis not present

## 2020-01-12 DIAGNOSIS — Z6830 Body mass index (BMI) 30.0-30.9, adult: Secondary | ICD-10-CM | POA: Diagnosis not present

## 2020-01-12 LAB — LIPID PANEL
Cholesterol: 207 mg/dL — ABNORMAL HIGH (ref 0–200)
HDL: 51.9 mg/dL (ref >39.00)
NonHDL: 154.69
Total CHOL/HDL Ratio: 4
Triglycerides: 310 mg/dL — ABNORMAL HIGH (ref 0.0–149.0)
VLDL: 62 mg/dL — ABNORMAL HIGH (ref 0.0–40.0)

## 2020-01-12 LAB — COMPREHENSIVE METABOLIC PANEL
ALT: 14 U/L (ref 0–35)
AST: 18 U/L (ref 0–37)
Albumin: 4.5 g/dL (ref 3.5–5.2)
Alkaline Phosphatase: 79 U/L (ref 39–117)
BUN: 11 mg/dL (ref 6–23)
CO2: 32 mEq/L (ref 19–32)
Calcium: 9.4 mg/dL (ref 8.4–10.5)
Chloride: 103 mEq/L (ref 96–112)
Creatinine, Ser: 0.73 mg/dL (ref 0.40–1.20)
GFR: 83.03 mL/min (ref >60.00)
Glucose, Bld: 80 mg/dL (ref 70–99)
Potassium: 4.6 mEq/L (ref 3.5–5.1)
Sodium: 142 mEq/L (ref 135–145)
Total Bilirubin: 0.4 mg/dL (ref 0.2–1.2)
Total Protein: 6.9 g/dL (ref 6.0–8.3)

## 2020-01-12 LAB — LDL CHOLESTEROL, DIRECT: Direct LDL: 76 mg/dL

## 2020-01-12 LAB — CBC WITH DIFFERENTIAL/PLATELET
Basophils Absolute: 0 10*3/uL (ref 0.0–0.1)
Basophils Relative: 0.4 % (ref 0.0–3.0)
Eosinophils Absolute: 0.1 10*3/uL (ref 0.0–0.7)
Eosinophils Relative: 2.7 % (ref 0.0–5.0)
HCT: 41.7 % (ref 36.0–46.0)
Hemoglobin: 13.9 g/dL (ref 12.0–15.0)
Lymphocytes Relative: 51.8 % — ABNORMAL HIGH (ref 12.0–46.0)
Lymphs Abs: 2.1 10*3/uL (ref 0.7–4.0)
MCHC: 33.4 g/dL (ref 30.0–36.0)
MCV: 89.9 fl (ref 78.0–100.0)
Monocytes Absolute: 0.4 10*3/uL (ref 0.1–1.0)
Monocytes Relative: 8.6 % (ref 3.0–12.0)
Neutro Abs: 1.5 10*3/uL (ref 1.4–7.7)
Neutrophils Relative %: 36.5 % — ABNORMAL LOW (ref 43.0–77.0)
Platelets: 203 10*3/uL (ref 150.0–400.0)
RBC: 4.64 Mil/uL (ref 3.87–5.11)
RDW: 13.1 % (ref 11.5–15.5)
WBC: 4.1 10*3/uL (ref 4.0–10.5)

## 2020-01-12 LAB — VITAMIN D 25 HYDROXY (VIT D DEFICIENCY, FRACTURES): VITD: 30.95 ng/mL (ref 30.00–100.00)

## 2020-01-12 LAB — HEMOGLOBIN A1C: Hgb A1c MFr Bld: 5.5 % (ref 4.6–6.5)

## 2020-01-17 ENCOUNTER — Encounter: Payer: 59 | Admitting: Family Medicine

## 2020-01-20 ENCOUNTER — Other Ambulatory Visit: Payer: Self-pay

## 2020-01-20 ENCOUNTER — Ambulatory Visit (HOSPITAL_COMMUNITY)
Admission: EM | Admit: 2020-01-20 | Discharge: 2020-01-20 | Disposition: A | Discharge status: Home / Self Care | Payer: 59 | Attending: Internal Medicine | Admitting: Internal Medicine

## 2020-01-20 DIAGNOSIS — Z1152 Encounter for screening for COVID-19: Secondary | ICD-10-CM | POA: Diagnosis not present

## 2020-01-20 DIAGNOSIS — Z20822 Contact with and (suspected) exposure to covid-19: Secondary | ICD-10-CM | POA: Insufficient documentation

## 2020-01-20 LAB — SARS CORONAVIRUS 2 (TAT 6-24 HRS): SARS Coronavirus 2: NEGATIVE

## 2020-01-20 NOTE — Discharge Instructions (Signed)
If your Covid-19 test is positive, you will get a phone call from [facility] regarding your results. If your Covid-19 test is negative, you will NOT get a phone call from [facility] with your results. You may view your results on MyChart. If you do not have a MyChart account, sign up instructions are in your discharge papers. ° °

## 2020-01-20 NOTE — ED Triage Notes (Signed)
Pt presents for covid testing with no symptoms.

## 2020-01-26 ENCOUNTER — Encounter: Payer: 59 | Admitting: Gastroenterology

## 2020-02-16 ENCOUNTER — Encounter: Payer: 59 | Admitting: Family Medicine

## 2020-03-07 ENCOUNTER — Other Ambulatory Visit: Payer: Self-pay | Admitting: Family Medicine

## 2020-03-07 DIAGNOSIS — F5104 Psychophysiologic insomnia: Secondary | ICD-10-CM

## 2020-03-07 NOTE — Telephone Encounter (Signed)
Refill request Trazodone Last refill 12/13/19 #90 Last office visit 12/15/19

## 2020-04-14 ENCOUNTER — Encounter: Payer: 59 | Admitting: Family Medicine

## 2020-05-08 ENCOUNTER — Encounter: Payer: 59 | Admitting: Family Medicine

## 2020-06-12 ENCOUNTER — Emergency Department
Admission: EM | Admit: 2020-06-12 | Discharge: 2020-06-12 | Disposition: A | Discharge status: Home / Self Care | Payer: 59 | Attending: Emergency Medicine | Admitting: Emergency Medicine

## 2020-06-12 ENCOUNTER — Other Ambulatory Visit: Payer: Self-pay

## 2020-06-12 ENCOUNTER — Emergency Department: Payer: 59

## 2020-06-12 ENCOUNTER — Encounter: Payer: Self-pay | Admitting: Emergency Medicine

## 2020-06-12 DIAGNOSIS — W009XXA Unspecified fall due to ice and snow, initial encounter: Secondary | ICD-10-CM | POA: Diagnosis not present

## 2020-06-12 DIAGNOSIS — M545 Low back pain, unspecified: Secondary | ICD-10-CM | POA: Insufficient documentation

## 2020-06-12 DIAGNOSIS — F17210 Nicotine dependence, cigarettes, uncomplicated: Secondary | ICD-10-CM | POA: Insufficient documentation

## 2020-06-12 DIAGNOSIS — M25552 Pain in left hip: Secondary | ICD-10-CM | POA: Insufficient documentation

## 2020-06-12 MED ORDER — HYDROCODONE-ACETAMINOPHEN 5-325 MG PO TABS: 1.0000 | ORAL_TABLET | Freq: Four times a day (QID) | ORAL | 0 refills | Status: AC | PRN

## 2020-06-12 MED ORDER — NAPROXEN 500 MG PO TABS: 500.0000 mg | ORAL_TABLET | Freq: Two times a day (BID) | ORAL | 0 refills | Status: DC

## 2020-06-12 MED ORDER — CYCLOBENZAPRINE HCL 10 MG PO TABS: 10.0000 mg | ORAL_TABLET | Freq: Three times a day (TID) | ORAL | 0 refills | Status: DC | PRN

## 2020-06-12 MED ORDER — OXYCODONE-ACETAMINOPHEN 5-325 MG PO TABS
1.0000 | ORAL_TABLET | ORAL | Status: DC | PRN
  Administered 2020-06-12: 1 via ORAL
  Filled 2020-06-12: qty 1

## 2020-06-12 NOTE — ED Provider Notes (Signed)
[facility] Emergency Department Provider Note ____________________________________________  Time seen: Approximately 12:29 PM  I have reviewed the triage vital signs and the nursing notes.   HISTORY  Chief Complaint Fall, Back Pain, and Hip Pain    HPI Traci Oliver is a 55 y.o. female who presents to the emergency department for evaluation and treatment of left lower back pain after falling on ice prior to arrival. She slipped and hit her lower back on the steps. No alleviating measures prior to arrival.   Past Medical History:  Diagnosis Date  . Anxiety   . Depression   . Plantar fasciitis   . Urinary, incontinence, stress female     Patient Active Problem List   Diagnosis Date Noted  . Psychophysiological insomnia 09/22/2019  . Light tobacco smoker <10 cigarettes per day 09/22/2019  . Urinary incontinence 07/04/2017  . Hot flashes, menopausal 02/03/2015  . Mood swings 02/03/2015  . Anxiety and depression 02/03/2015  . Family history of ovarian cancer 02/03/2015  . PLANTAR FASCIITIS, BILATERAL 07/13/2010  . VENTRAL HERNIA 11/24/2008  . ECZEMA 11/24/2008  . INCONTINENCE, URGE 11/24/2008  . DENTAL PAIN 04/17/2007  . PANIC ATTACK 12/31/2006  . CIGARETTE SMOKER 12/31/2006  . PAP SMEAR, ABNORMAL 12/31/2006    Past Surgical History:  Procedure Laterality Date  . CESAREAN SECTION    . TUBAL LIGATION      Prior to Admission medications   Medication Sig Start Date End Date Taking? Authorizing Provider  cyclobenzaprine (FLEXERIL) 10 MG tablet Take 1 tablet (10 mg total) by mouth 3 (three) times daily as needed. 06/12/20  Yes Amaranta Mehl B, FNP  HYDROcodone-acetaminophen (NORCO/VICODIN) 5-325 MG tablet Take 1 tablet by mouth every 6 (six) hours as needed for up to 3 days for severe pain. 06/12/20 06/15/20 Yes Eman Morimoto B, FNP  naproxen (NAPROSYN) 500 MG tablet Take 1 tablet (500 mg total) by mouth 2 (two) times daily with a meal. 06/12/20  Yes  Eamon Tantillo B, FNP  clonazePAM (KLONOPIN) 0.5 MG tablet Take 0.5-1 tablets (0.25-0.5 mg total) by mouth daily as needed for anxiety. 09/22/19   Elby Beck, FNP  FLUoxetine (PROZAC) 20 MG capsule Take 1 capsule (20 mg total) by mouth every morning. 09/22/19   Elby Beck, FNP  ibuprofen (ADVIL,MOTRIN) 600 MG tablet Take 1 tablet (600 mg total) by mouth every 6 (six) hours as needed. 09/10/15   Comer Locket, PA-C  traZODone (DESYREL) 50 MG tablet TAKE 1 TABLET (50 MG TOTAL) BY MOUTH AT BEDTIME AS NEEDED FOR SLEEP. 03/07/20   Elby Beck, FNP    Allergies Patient has no known allergies.  Family History  Problem Relation Age of Onset  . Cancer Mother        ovarian  . Heart attack Father   . Heart disease Father   . Diabetes Maternal Grandmother   . Cancer Maternal Grandmother        lung  . Hypertension Maternal Grandmother   . Cancer Maternal Grandfather        lung    Social History Social History   Tobacco Use  . Smoking status: Light Tobacco Smoker    Types: Cigarettes  . Smokeless tobacco: Never Used  Substance Use Topics  . Alcohol use: Yes    Alcohol/week: 0.0 standard drinks  . Drug use: No    Review of Systems Constitutional: Negative for fever. Cardiovascular: Negative for chest pain. Respiratory: Negative for shortness of breath. Musculoskeletal: Positive for left  lower back pain. Skin: No contusions, abrasions, or lesions over the back/posterior pelvis.  Neurological: Negative for decrease in sensation  ____________________________________________   PHYSICAL EXAM:  VITAL SIGNS: ED Triage Vitals  Enc Vitals Group     BP 06/12/20 1058 (!) 126/92     Pulse Rate 06/12/20 1058 74     Resp 06/12/20 1058 18     Temp 06/12/20 1058 97.6 F (36.4 C)     Temp Source 06/12/20 1058 Oral     SpO2 06/12/20 1058 97 %     Weight 06/12/20 1051 173 lb 1 oz (78.5 kg)     Height 06/12/20 1141 5' 3.5" (1.613 m)     Head Circumference --       Peak Flow --      Pain Score --      Pain Loc --      Pain Edu? --      Excl. in [place]? --     Constitutional: Alert and oriented. Well appearing and in no acute distress. Eyes: Conjunctivae are clear without discharge or drainage Head: Atraumatic Neck: Supple Respiratory: No cough. Respirations are even and unlabored. Musculoskeletal: No focal tenderness over the lumbar. Tenderness over the posterior pelvic region on the left side. Neurologic: Awake, alert, oriented.  Skin: No open wounds, contusions, lesions.  Psychiatric: Affect and behavior are appropriate.  ____________________________________________   LABS (all labs ordered are listed, but only abnormal results are displayed)  Labs Reviewed - No data to display ____________________________________________  RADIOLOGY  Image of the hip and pelvis negative for acute findings.  I, Sherrie George, personally viewed and evaluated these images (plain radiographs) as part of my medical decision making, as well as reviewing the written report by the radiologist.  DG Hip Unilat With Pelvis 2-3 Views Left  Result Date: 06/12/2020 CLINICAL DATA:  Left hip pain after fall. EXAM: DG HIP (WITH OR WITHOUT PELVIS) 2-3V LEFT COMPARISON:  None FINDINGS: Pelvic bony ring is intact. Left hip is located without a fracture. Evidence for scattered phleboliths in the pelvis. Symmetric appearance of the SI joints. IMPRESSION: No acute bone abnormality to the pelvis or left hip. Electronically Signed   By: Markus Daft M.D.   On: 06/12/2020 11:31   ____________________________________________   PROCEDURES  Procedures  ____________________________________________   INITIAL IMPRESSION / ASSESSMENT AND PLAN / ED COURSE  Traci Oliver is a 55 y.o. who presents to the emergency department for treatment and evaluation after falling on ice.   Imaging is negative. She was able to stand without assistance. Home care discussed and she was discharged  home.   Medications  oxyCODONE-acetaminophen (PERCOCET/ROXICET) 5-325 MG per tablet 1 tablet (1 tablet Oral Given 06/12/20 1209)    Pertinent labs & imaging results that were available during my care of the patient were reviewed by me and considered in my medical decision making (see chart for details).   _________________________________________   FINAL CLINICAL IMPRESSION(S) / ED DIAGNOSES  Final diagnoses:  Acute left-sided low back pain without sciatica    ED Discharge Orders         Ordered    cyclobenzaprine (FLEXERIL) 10 MG tablet  3 times daily PRN        06/12/20 1234    HYDROcodone-acetaminophen (NORCO/VICODIN) 5-325 MG tablet  Every 6 hours PRN        06/12/20 1234    naproxen (NAPROSYN) 500 MG tablet  2 times daily with meals  06/12/20 1234           If controlled substance prescribed during this visit, 12 month history viewed on the [place] prior to issuing an initial prescription for Schedule II or III opiod.   Victorino Dike, FNP 06/12/20 1608    Vladimir Crofts, MD 06/13/20 [phone]

## 2020-06-12 NOTE — ED Notes (Signed)
See triage note  States she slipped on ice  Having pain to left lower back and hip area

## 2020-06-12 NOTE — ED Triage Notes (Signed)
First nurse: Pt in via [place] after slip fall on ice, c/o back and L hip pain. Ambulatory on scene, no LOC, no thinners.  EMS VS: 168/104, 75HR, 97%

## 2020-06-14 ENCOUNTER — Ambulatory Visit: Payer: 59 | Admitting: Family Medicine

## 2020-06-14 ENCOUNTER — Other Ambulatory Visit: Payer: Self-pay

## 2020-06-14 VITALS — BP 142/100 | HR 88 | Temp 98.2°F | Ht 63.0 in | Wt 176.2 lb

## 2020-06-14 DIAGNOSIS — S39012A Strain of muscle, fascia and tendon of lower back, initial encounter: Secondary | ICD-10-CM | POA: Insufficient documentation

## 2020-06-14 DIAGNOSIS — R030 Elevated blood-pressure reading, without diagnosis of hypertension: Secondary | ICD-10-CM

## 2020-06-14 DIAGNOSIS — S39012D Strain of muscle, fascia and tendon of lower back, subsequent encounter: Secondary | ICD-10-CM | POA: Diagnosis not present

## 2020-06-14 DIAGNOSIS — I10 Essential (primary) hypertension: Secondary | ICD-10-CM | POA: Insufficient documentation

## 2020-06-14 NOTE — Progress Notes (Signed)
Subjective:     Traci Oliver is a 55 y.o. female presenting for ER follow-up (Fall from standing position, on ice. Hit back on steps )     HPI  #Fall on ice - laid outside - needed EMS to move her - pain is in the back - was only able to start walking at 6 pm last night - severe burning pain with movement - alternating heat and ice - laying in bed - worse with movement - flexeril - with improvement - hydrocodone with improvement - also taking ibuprofen 800 mg as   Review of Systems  06/12/2020: ER - fall and left lower back pain. Hip XR without abnormality. Prescribed cyclobenzaprine and norco and naproxen  Social History   Tobacco Use  Smoking Status Light Tobacco Smoker  . Types: Cigarettes  Smokeless Tobacco Never Used        Objective:    BP Readings from Last 3 Encounters:  06/14/20 (!) 142/100  06/12/20 (!) 126/92  01/20/20 132/87   Wt Readings from Last 3 Encounters:  06/14/20 176 lb 4 oz (79.9 kg)  06/12/20 173 lb 1 oz (78.5 kg)  12/15/19 173 lb (78.5 kg)    BP (!) 142/100   Pulse 88   Temp 98.2 F (36.8 C) (Temporal)   Ht 5\' 3"  (1.6 m)   Wt 176 lb 4 oz (79.9 kg)   SpO2 97%   BMI 31.22 kg/m    Physical Exam Constitutional:      General: She is not in acute distress.    Appearance: She is well-developed. She is not diaphoretic.  HENT:     Right Ear: External ear normal.     Left Ear: External ear normal.     Nose: Nose normal.  Eyes:     Conjunctiva/sclera: Conjunctivae normal.  Cardiovascular:     Rate and Rhythm: Normal rate.  Pulmonary:     Effort: Pulmonary effort is normal.  Musculoskeletal:     Cervical back: Neck supple.     Comments: Back:  Inspection: no erythema, no swelling Palpation: TTP along the left paraspinous muscles lower thoracic to lumbar. No spinous ttp ROM: will not try flexion/extension 2/2 to pain. Rotation and lateral flexion normal but with slow movement and pain Strength: walking and moving but  slowly Straight leg raise: negative  Skin:    General: Skin is warm and dry.     Capillary Refill: Capillary refill takes less than 2 seconds.  Neurological:     Mental Status: She is alert. Mental status is at baseline.  Psychiatric:        Mood and Affect: Mood normal.        Behavior: Behavior normal.     DG Hip Unilat With Pelvis 2-3 Views Left CLINICAL DATA:  Left hip pain after fall.  EXAM: DG HIP (WITH OR WITHOUT PELVIS) 2-3V LEFT  COMPARISON:  None  FINDINGS: Pelvic bony ring is intact. Left hip is located without a fracture. Evidence for scattered phleboliths in the pelvis. Symmetric appearance of the SI joints.  IMPRESSION: No acute bone abnormality to the pelvis or left hip.  Electronically Signed   By: Markus Daft M.D.   On: 06/12/2020 11:31        Assessment & Plan:   Problem List Items Addressed This Visit      Musculoskeletal and Integument   Strain of lumbar region - Primary    Reviewed ER work-up and treatment including normal left hip film.  Reassurance to patient that no signs of nerve impingement on exam or bony tenderness to suggest fracture. Discussed pain control regimen NSAIDs (do not take ibuprofen and naproxen), prn flexeril, and if emergency to take hydrocodone. Work note for 2 more days. Call next week if not improving and could consider ortho consult or trial of prednisone.         Other   Elevated blood pressure reading    Suspect 2/2 to pain. Will watch.           Return if symptoms worsen or fail to improve.  Lesleigh Noe, MD  This visit occurred during the SARS-CoV-2 public health emergency.  Safety protocols were in place, including screening questions prior to the visit, additional usage of staff PPE, and extensive cleaning of exam room while observing appropriate contact time as indicated for disinfecting solutions.

## 2020-06-14 NOTE — Assessment & Plan Note (Signed)
Suspect 2/2 to pain. Will watch.

## 2020-06-14 NOTE — Assessment & Plan Note (Signed)
Reviewed ER work-up and treatment including normal left hip film. Reassurance to patient that no signs of nerve impingement on exam or bony tenderness to suggest fracture. Discussed pain control regimen NSAIDs (do not take ibuprofen and naproxen), prn flexeril, and if emergency to take hydrocodone. Work note for 2 more days. Call next week if not improving and could consider ortho consult or trial of prednisone.

## 2020-06-14 NOTE — Patient Instructions (Addendum)
Medications:   Daily: Naproxen 500 mg twice daily, can also take 1000 mg of Tylenol twice daily  Do not take with ibuprofen (800 mg every 8 hours)   As needed: Flexeril (nighttime) - spasms  In case of emergency -- Take Hydrocodone  Call next week or mychart -- if no improvement - can consider physical therapy or referral for ortho

## 2020-07-12 ENCOUNTER — Other Ambulatory Visit: Payer: Self-pay

## 2020-07-12 ENCOUNTER — Ambulatory Visit: Payer: 59 | Admitting: Family Medicine

## 2020-07-12 ENCOUNTER — Encounter: Payer: Self-pay | Admitting: Family Medicine

## 2020-07-12 VITALS — BP 128/94 | HR 90 | Temp 97.0°F | Ht 63.0 in | Wt 174.0 lb

## 2020-07-12 DIAGNOSIS — I10 Essential (primary) hypertension: Secondary | ICD-10-CM

## 2020-07-12 DIAGNOSIS — Z0000 Encounter for general adult medical examination without abnormal findings: Secondary | ICD-10-CM

## 2020-07-12 DIAGNOSIS — F419 Anxiety disorder, unspecified: Secondary | ICD-10-CM | POA: Diagnosis not present

## 2020-07-12 DIAGNOSIS — Z1211 Encounter for screening for malignant neoplasm of colon: Secondary | ICD-10-CM

## 2020-07-12 DIAGNOSIS — F32A Depression, unspecified: Secondary | ICD-10-CM

## 2020-07-12 NOTE — Patient Instructions (Addendum)
Check with insurance about Shingles Vaccine   Please call the location of your choice from the menu below to schedule your Mammogram and/or Bone Density appointment.    [place]   1. [facility]                      Phone:  [phone]                               [place], [place] [address]                                                             Services: Traditional and 3D Mammogram, Bone Density   #Referral I have placed a referral to a specialist for you. You should receive a phone call from the specialty office. Make sure your voicemail is not full and that if you are able to answer your phone to unknown or new numbers.   It may take up to 2 weeks to hear about the referral. If you do not hear anything in 2 weeks, please call our office and ask to speak with the referral coordinator.     Your blood pressure high.   High blood pressure increases your risk for heart attack and stroke.    Please check your blood pressure 2-4 times a week.   To check your blood pressure 1) Sit in a quiet and relaxed place for 5 minutes 2) Make sure your feet are flat on the ground 3) Consider checking first thing in the morning   Normal blood pressure is less than 140/90 Ideally you blood pressure should be around 120/80  Other ways you can reduce your blood pressure:  1) Regular exercise -- Try to get 150 minutes (30 minutes, 5 days a week) of moderate to vigorous aerobic excercise -- Examples: brisk walking (2.5 miles per hour), water aerobics, dancing, gardening, tennis, biking slower than 10 miles per hour 2) DASH Diet - low fat meats, more fresh fruits and vegetables, whole grains, low salt 3) Quit smoking if you smoke 4) Loose 5-10% of your body weight   [email or web address]">  DASH Eating Plan DASH stands for Dietary Approaches to Stop Hypertension. The DASH eating plan is a healthy  eating plan that has been shown to:  Reduce high blood pressure (hypertension).  Reduce your risk for type 2 diabetes, heart disease, and stroke.  Help with weight loss. What are tips for following this plan? Reading food labels  Check food labels for the amount of salt (sodium) per serving. Choose foods with less than 5 percent of the Daily Value of sodium. Generally, foods with less than 300 milligrams (mg) of sodium per serving fit into this eating plan.  To find whole grains, look for the word "whole" as the first word in the ingredient list. Shopping  Buy products labeled as "low-sodium" or "no salt added."  Buy fresh foods. Avoid canned foods and pre-made or frozen meals. Cooking  Avoid adding salt when cooking. Use salt-free seasonings or herbs instead of table salt or sea salt. Check with your health care provider or pharmacist before using salt substitutes.  Do not fry foods. Cook foods using  healthy methods such as baking, boiling, grilling, roasting, and broiling instead.  Cook with heart-healthy oils, such as olive, canola, avocado, soybean, or sunflower oil. Meal planning  Eat a balanced diet that includes: ? 4 or more servings of fruits and 4 or more servings of vegetables each day. Try to fill one-half of your plate with fruits and vegetables. ? 6-8 servings of whole grains each day. ? Less than 6 oz (170 g) of lean meat, poultry, or fish each day. A 3-oz (85-g) serving of meat is about the same size as a deck of cards. One egg equals 1 oz (28 g). ? 2-3 servings of low-fat dairy each day. One serving is 1 cup (237 mL). ? 1 serving of nuts, seeds, or beans 5 times each week. ? 2-3 servings of heart-healthy fats. Healthy fats called omega-3 fatty acids are found in foods such as walnuts, flaxseeds, fortified milks, and eggs. These fats are also found in cold-water fish, such as sardines, salmon, and mackerel.  Limit how much you eat of: ? Canned or prepackaged  foods. ? Food that is high in trans fat, such as some fried foods. ? Food that is high in saturated fat, such as fatty meat. ? Desserts and other sweets, sugary drinks, and other foods with added sugar. ? Full-fat dairy products.  Do not salt foods before eating.  Do not eat more than 4 egg yolks a week.  Try to eat at least 2 vegetarian meals a week.  Eat more home-cooked food and less restaurant, buffet, and fast food.   Lifestyle  When eating at a restaurant, ask that your food be prepared with less salt or no salt, if possible.  If you drink alcohol: ? Limit how much you use to:  0-1 drink a day for women who are not pregnant.  0-2 drinks a day for men. ? Be aware of how much alcohol is in your drink. In the [place] one drink equals one 12 oz bottle of beer (355 mL), one 5 oz glass of wine (148 mL), or one 1 oz glass of hard liquor (44 mL). General information  Avoid eating more than 2,300 mg of salt a day. If you have hypertension, you may need to reduce your sodium intake to 1,500 mg a day.  Work with your health care provider to maintain a healthy body weight or to lose weight. Ask what an ideal weight is for you.  Get at least 30 minutes of exercise that causes your heart to beat faster (aerobic exercise) most days of the week. Activities may include walking, swimming, or biking.  Work with your health care provider or dietitian to adjust your eating plan to your individual calorie needs. What foods should I eat? Fruits All fresh, dried, or frozen fruit. Canned fruit in natural juice (without added sugar). Vegetables Fresh or frozen vegetables (raw, steamed, roasted, or grilled). Low-sodium or reduced-sodium tomato and vegetable juice. Low-sodium or reduced-sodium tomato sauce and tomato paste. Low-sodium or reduced-sodium canned vegetables. Grains Whole-grain or whole-wheat bread. Whole-grain or whole-wheat pasta. Brown rice. [place]. Bulgur. Whole-grain and  low-sodium cereals. Pita bread. Low-fat, low-sodium crackers. Whole-wheat flour tortillas. Meats and other proteins Skinless chicken or [place]. Ground chicken or [place]. Pork with fat trimmed off. Fish and seafood. Egg whites. Dried beans, peas, or lentils. Unsalted nuts, nut butters, and seeds. Unsalted canned beans. Lean cuts of beef with fat trimmed off. Low-sodium, lean precooked or cured meat, such as sausages or meat loaves.  Dairy Low-fat (1%) or fat-free (skim) milk. Reduced-fat, low-fat, or fat-free cheeses. Nonfat, low-sodium ricotta or cottage cheese. Low-fat or nonfat yogurt. Low-fat, low-sodium cheese. Fats and oils Soft margarine without trans fats. Vegetable oil. Reduced-fat, low-fat, or light mayonnaise and salad dressings (reduced-sodium). Canola, safflower, olive, avocado, soybean, and sunflower oils. Avocado. Seasonings and condiments Herbs. Spices. Seasoning mixes without salt. Other foods Unsalted popcorn and pretzels. Fat-free sweets. The items listed above may not be a complete list of foods and beverages you can eat. Contact a dietitian for more information. What foods should I avoid? Fruits Canned fruit in a light or heavy syrup. Fried fruit. Fruit in cream or butter sauce. Vegetables Creamed or fried vegetables. Vegetables in a cheese sauce. Regular canned vegetables (not low-sodium or reduced-sodium). Regular canned tomato sauce and paste (not low-sodium or reduced-sodium). Regular tomato and vegetable juice (not low-sodium or reduced-sodium). Angie Fava. Olives. Grains Baked goods made with fat, such as croissants, muffins, or some breads. Dry pasta or rice meal packs. Meats and other proteins Fatty cuts of meat. Ribs. Fried meat. Berniece Salines. Bologna, salami, and other precooked or cured meats, such as sausages or meat loaves. Fat from the back of a pig (fatback). Bratwurst. Salted nuts and seeds. Canned beans with added salt. Canned or smoked fish. Whole eggs or egg yolks.  Chicken or [place] with skin. Dairy Whole or 2% milk, cream, and half-and-half. Whole or full-fat cream cheese. Whole-fat or sweetened yogurt. Full-fat cheese. Nondairy creamers. Whipped toppings. Processed cheese and cheese spreads. Fats and oils Butter. Stick margarine. Lard. Shortening. Ghee. Bacon fat. Tropical oils, such as coconut, palm kernel, or palm oil. Seasonings and condiments Onion salt, garlic salt, seasoned salt, table salt, and sea salt. Worcestershire sauce. Tartar sauce. Barbecue sauce. Teriyaki sauce. Soy sauce, including reduced-sodium. Steak sauce. Canned and packaged gravies. Fish sauce. Oyster sauce. Cocktail sauce. Store-bought horseradish. Ketchup. Mustard. Meat flavorings and tenderizers. Bouillon cubes. Hot sauces. Pre-made or packaged marinades. Pre-made or packaged taco seasonings. Relishes. Regular salad dressings. Other foods Salted popcorn and pretzels. The items listed above may not be a complete list of foods and beverages you should avoid. Contact a dietitian for more information. Where to find more information  National Heart, Lung, and Blood Institute: https://wilson-eaton.com/  American Heart Association: www.heart.org  Academy of Nutrition and Dietetics: www.eatright.[place]: www.kidney.org Summary  The DASH eating plan is a healthy eating plan that has been shown to reduce high blood pressure (hypertension). It may also reduce your risk for type 2 diabetes, heart disease, and stroke.  When on the DASH eating plan, aim to eat more fresh fruits and vegetables, whole grains, lean proteins, low-fat dairy, and heart-healthy fats.  With the DASH eating plan, you should limit salt (sodium) intake to 2,300 mg a day. If you have hypertension, you may need to reduce your sodium intake to 1,500 mg a day.  Work with your health care provider or dietitian to adjust your eating plan to your individual calorie needs. This information is not intended  to replace advice given to you by your health care provider. Make sure you discuss any questions you have with your health care provider. Document Revised: 03/26/2019 Document Reviewed: 03/26/2019 Elsevier Patient Education  2021 [employer].

## 2020-07-12 NOTE — Progress Notes (Signed)
Annual Exam   Chief Complaint:  Chief Complaint  Patient presents with  . Establish Care    TOC from [facility]    History of Present Illness:  Traci Oliver is a 55 y.o. [phone] who LMP was No LMP recorded. Patient is postmenopausal., presents today for her annual examination.     Nutrition She does get adequate calcium and Vitamin D in her diet. Exercise: 13,000 steps at work Diet: limiting carbs, no sodas, trying to eat healthy  Safety The patient wears seatbelts: yes.     The patient feels safe at home and in their relationships: yes.   Menstrual:  No symptoms  GYN She is single partner, contraception - post menopausal status.    Cervical Cancer Screening (21-65):   Last Pap:   September 2016 Results were: no abnormalities /neg HPV DNA   Breast Cancer Screening (Age 29-74):  There is no FH of breast cancer. There is FH of ovarian cancer. BRCA screening Yes.  Last Mammogram: 07/2017 The patient does want a mammogram this year.    Colon Cancer Screening:  Age 82-75 yo - benefits outweigh the risk. Adults 82-85 yo who have never been screened benefit.  Benefits: 134000 people in 2016 will be diagnosed and 49,000 will die - early detection helps [place]: Complications 2/2 to colonoscopy High Risk (Colonoscopy): genetic disorder (Lynch syndrome or familial adenomatous polyposis), personal hx of IBD, previous adenomatous polyp, or previous colorectal cancer, FamHx start 10 years before the age at diagnosis, increased in males and black race  Options:  FIT - looks for hemoglobin (blood in the stool) - specific and fairly sensitive - must be done annually Cologuard - looks for DNA and blood - more sensitive - therefore can have more false positives, every 3 years Colonoscopy - every 10 years if normal - sedation, bowl prep, must have someone drive you  Shared decision making and the patient had decided to do colonscopy.   Social History   Tobacco Use  Smoking  Status Light Tobacco Smoker  . Packs/day: 0.25  . Years: 35.00  . Pack years: 8.75  . Types: Cigarettes  Smokeless Tobacco Never Used  Tobacco Comment   only a few cig per day    Lung Cancer Screening (Ages 99-83): not applicable   Weight Wt Readings from Last 3 Encounters:  07/12/20 174 lb (78.9 kg)  06/14/20 176 lb 4 oz (79.9 kg)  06/12/20 173 lb 1 oz (78.5 kg)   Patient has high BMI  BMI Readings from Last 1 Encounters:  07/12/20 30.82 kg/m     Chronic disease screening Blood pressure monitoring:  BP Readings from Last 3 Encounters:  07/12/20 (!) 128/94  06/14/20 (!) 142/100  06/12/20 (!) 126/92    Lipid Monitoring: Indication for screening: age >49, obesity, diabetes, family hx, CV risk factors.  Lipid screening: Yes  Lab Results  Component Value Date   CHOL 207 (H) 01/12/2020   HDL 51.90 01/12/2020   LDLDIRECT 76.0 01/12/2020   TRIG 310.0 (H) 01/12/2020   CHOLHDL 4 01/12/2020     Diabetes Screening: age >53, overweight, family hx, PCOS, hx of gestational diabetes, at risk ethnicity Diabetes Screening screening: No  Lab Results  Component Value Date   HGBA1C 5.5 01/12/2020     Past Medical History:  Diagnosis Date  . Anxiety   . Depression   . Plantar fasciitis   . Urinary, incontinence, stress female     Past Surgical History:  Procedure Laterality Date  .  CESAREAN SECTION    . TUBAL LIGATION      Prior to Admission medications   Medication Sig Start Date End Date Taking? Authorizing Provider  FLUoxetine (PROZAC) 20 MG capsule Take 1 capsule (20 mg total) by mouth every morning. 09/22/19  Yes Elby Beck, FNP  naproxen (NAPROSYN) 500 MG tablet Take 1 tablet (500 mg total) by mouth 2 (two) times daily with a meal. 06/12/20  Yes Triplett, Cari B, FNP    No Known Allergies  Gynecologic History: No LMP recorded. Patient is postmenopausal.  Obstetric History: G2P1103  Social History   Socioeconomic History  . Marital status:  Single    Spouse name: Not on file  . Number of children: 3  . Years of education: high school   . Highest education level: Not on file  Occupational History  . Not on file  Tobacco Use  . Smoking status: Light Tobacco Smoker    Packs/day: 0.25    Years: 35.00    Pack years: 8.75    Types: Cigarettes  . Smokeless tobacco: Never Used  . Tobacco comment: only a few cig per day  Vaping Use  . Vaping Use: Never used  Substance and Sexual Activity  . Alcohol use: Yes    Alcohol/week: 0.0 standard drinks    Comment: 3 times a week, 2-3 drinks  . Drug use: No  . Sexual activity: Yes    Birth control/protection: Post-menopausal  Other Topics Concern  . 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 4 grandchildren - live nearby      Enjoys: spend time with grandchildren      Exercise: 13,000 steps at work   Diet: limiting carbs, no sodas, trying to eat healthy      Safety   Seat belts: Yes    Guns: No   Safe in relationships: Yes    Social Determinants of Radio broadcast assistant Strain: Not on file  Food Insecurity: Not on file  Transportation Needs: Not on file  Physical Activity: Not on file  Stress: Not on file  Social Connections: Not on file  Intimate Partner Violence: Not on file    Family History  Problem Relation Age of Onset  . Ovarian cancer Mother 67  . Heart attack Father 42  . Heart disease Father   . Diabetes Maternal Grandmother   . Hypertension Maternal Grandmother   . Lung cancer Maternal Grandmother   . Lung cancer Maternal Grandfather     Review of Systems  Constitutional: Negative for chills and fever.  HENT: Negative for congestion and sore throat.   Eyes: Negative for blurred vision and double vision.  Respiratory: Negative for shortness of breath.   Cardiovascular: Negative for chest pain.  Gastrointestinal:  Negative for heartburn, nausea and vomiting.  Genitourinary: Negative.   Musculoskeletal: Negative.  Negative for myalgias.  Skin: Negative for rash.  Neurological: Negative for dizziness and headaches.  Endo/Heme/Allergies: Does not bruise/bleed easily.  Psychiatric/Behavioral: Negative for depression. The patient is not nervous/anxious.      Physical Exam BP (!) 128/94 (BP Location: Left Arm, Patient Position: Sitting, Cuff Size: Normal)   Pulse 90   Temp (!) 97 F (36.1 C) (Temporal)   Ht _0  (1.6 m)   Wt 174 lb (78.9 kg)   SpO2  97%   BMI 30.82 kg/m    BP Readings from Last 3 Encounters:  07/12/20 (!) 128/94  06/14/20 (!) 142/100  06/12/20 (!) 126/92      Physical Exam Constitutional:      General: She is not in acute distress.    Appearance: She is well-developed and well-nourished. She is not diaphoretic.  HENT:     Head: Normocephalic and atraumatic.     Right Ear: External ear normal.     Left Ear: External ear normal.     Nose: Nose normal.     Mouth/Throat:     Mouth: Oropharynx is clear and moist.  Eyes:     General: No scleral icterus.    Extraocular Movements: EOM normal.     Conjunctiva/sclera: Conjunctivae normal.  Cardiovascular:     Rate and Rhythm: Normal rate and regular rhythm.     Heart sounds: No murmur heard.   Pulmonary:     Effort: Pulmonary effort is normal. No respiratory distress.     Breath sounds: Normal breath sounds. No wheezing.  Abdominal:     General: Bowel sounds are normal. There is no distension.     Palpations: Abdomen is soft. There is no mass.     Tenderness: There is no abdominal tenderness. There is no guarding or rebound.  Musculoskeletal:        General: No edema. Normal range of motion.     Cervical back: Neck supple.  Lymphadenopathy:     Cervical: No cervical adenopathy.  Skin:    General: Skin is warm and dry.     Capillary Refill: Capillary refill takes less than 2 seconds.  Neurological:     Mental  Status: She is alert and oriented to person, place, and time.     Deep Tendon Reflexes: Strength normal. Reflexes normal.  Psychiatric:        Mood and Affect: Mood and affect normal.        Behavior: Behavior normal.     Results:  PHQ-9:  [facility] Visit from 09/22/2019 in [place] at [place]  PHQ-9 Total Score 3        Assessment: 55 y.o. [phone] female here for routine annual physical examination.  Plan: Problem List Items Addressed This Visit      Cardiovascular and Mediastinum   Essential hypertension    BP elevated. Lifestyle handout. Advised home monitoring. Return 3 months for recheck. Work on tobacco cessation        Other   Anxiety and depression    Stable on prozac 20 mg       Other Visit Diagnoses    Annual physical exam    -  Primary   Screening for colon cancer       Relevant Orders   Ambulatory referral to Gastroenterology      Screening: -- Blood pressure screen elevated: continued to monitor. -- cholesterol screening: up to date -- Weight screening: overweight: continue to monitor -- Diabetes Screening: not due for screening -- Nutrition: Encouraged healthy diet  The 10-year ASCVD risk score Mikey Bussing DC Jr., et al., 2013) is: 5%   Values used to calculate the score:     Age: 45 years     Sex: Female     Is Non-Hispanic African American: No     Diabetic: No     Tobacco smoker: Yes     Systolic Blood Pressure: 299 mmHg     Is BP treated: No  HDL Cholesterol: 51.9 mg/dL     Total Cholesterol: 207 mg/dL  -- Statin therapy for Age 67-75 with CVD risk >7.5%  Psych -- Depression screening (PHQ-9):  [facility] Visit from 09/22/2019 in [facility] [place] at [place]  PHQ-9 Total Score 3       Safety -- tobacco screening: encouraged quitting -- alcohol screening:  low-risk usage. -- no evidence of domestic violence or intimate partner violence.   Cancer Screening -- pap smear not collected per  patient request -- family history of breast cancer screening: done. not at high risk. -- Mammogram - number provided -- Colon cancer (age 60+)-- referral placed  Immunizations Immunization History  Administered Date(s) Administered  . Td 08/27/2006    -- flu vaccine - declined -- TDAP q10 years unknown, record requested - got one 9 years ago -- Shingles (age >56) she will check with insurance -- Covid-19 Vaccine declined   Encouraged healthy diet and exercise. Encouraged regular vision and dental care.    Lesleigh Noe, MD

## 2020-07-12 NOTE — Assessment & Plan Note (Signed)
Stable on prozac 20mg.  ?

## 2020-07-12 NOTE — Assessment & Plan Note (Signed)
BP elevated. Lifestyle handout. Advised home monitoring. Return 3 months for recheck. Work on tobacco cessation

## 2020-08-15 ENCOUNTER — Telehealth: Payer: Self-pay

## 2020-08-15 MED ORDER — NAPROXEN 500 MG PO TABS: 500.0000 mg | ORAL_TABLET | Freq: Two times a day (BID) | ORAL | 0 refills | Status: DC

## 2020-08-15 NOTE — Telephone Encounter (Signed)
Pharmacy requests refill on: Naproxen 500 mg   LAST REFILL: 06/12/2020 (Q-30, R-0) LAST OV: 07/12/2020 NEXT OV: Not Scheduled  PHARMACY: [facility] [place], [place]

## 2020-10-16 ENCOUNTER — Ambulatory Visit: Payer: 59 | Admitting: Family Medicine

## 2020-11-14 ENCOUNTER — Other Ambulatory Visit: Payer: Self-pay

## 2020-11-14 ENCOUNTER — Ambulatory Visit (AMBULATORY_SURGERY_CENTER): Payer: 59 | Admitting: *Deleted

## 2020-11-14 VITALS — Ht 63.0 in | Wt 174.6 lb

## 2020-11-14 DIAGNOSIS — Z1211 Encounter for screening for malignant neoplasm of colon: Secondary | ICD-10-CM

## 2020-11-14 MED ORDER — SUPREP BOWEL PREP KIT 17.5-3.13-1.6 GM/177ML PO SOLN: 1.0000 | Freq: Once | ORAL | 0 refills | Status: AC

## 2020-11-14 NOTE — Progress Notes (Signed)
  No trouble with anesthesia, denies trouble moving neck, or hx/fam hx of malignant hyperthermia per pt   No egg or soy allergy  No home oxygen use   No medications for weight loss taken  emmi information given  Pt denies constipation issues  Pt informed that we do not do prior authorizations for prep

## 2020-11-27 ENCOUNTER — Telehealth: Payer: Self-pay | Admitting: Gastroenterology

## 2020-11-27 NOTE — Telephone Encounter (Signed)
Hey Dr. Loletha Carrow,   Patient cancel procedure 11/28/20 due to daughter tested positive for COVID. Patient rescheduled for 8/23.  Thank you

## 2020-11-28 ENCOUNTER — Encounter: Payer: 59 | Admitting: Gastroenterology

## 2020-12-05 ENCOUNTER — Other Ambulatory Visit (HOSPITAL_COMMUNITY)
Admission: RE | Admit: 2020-12-05 | Discharge: 2020-12-05 | Disposition: A | Discharge status: Home / Self Care | Payer: 59 | Source: Ambulatory Visit | Attending: Family Medicine | Admitting: Family Medicine

## 2020-12-05 ENCOUNTER — Other Ambulatory Visit: Payer: Self-pay

## 2020-12-05 ENCOUNTER — Encounter: Payer: Self-pay | Admitting: Family Medicine

## 2020-12-05 ENCOUNTER — Ambulatory Visit (INDEPENDENT_AMBULATORY_CARE_PROVIDER_SITE_OTHER): Payer: 59 | Admitting: Family Medicine

## 2020-12-05 ENCOUNTER — Telehealth: Payer: Self-pay | Admitting: Family Medicine

## 2020-12-05 VITALS — BP 118/76 | HR 81 | Temp 97.3°F | Ht 63.0 in | Wt 178.0 lb

## 2020-12-05 DIAGNOSIS — E669 Obesity, unspecified: Secondary | ICD-10-CM

## 2020-12-05 DIAGNOSIS — I10 Essential (primary) hypertension: Secondary | ICD-10-CM

## 2020-12-05 DIAGNOSIS — Z124 Encounter for screening for malignant neoplasm of cervix: Secondary | ICD-10-CM | POA: Diagnosis present

## 2020-12-05 DIAGNOSIS — E782 Mixed hyperlipidemia: Secondary | ICD-10-CM

## 2020-12-05 DIAGNOSIS — F17210 Nicotine dependence, cigarettes, uncomplicated: Secondary | ICD-10-CM

## 2020-12-05 DIAGNOSIS — E66811 Obesity, class 1: Secondary | ICD-10-CM

## 2020-12-05 DIAGNOSIS — M79672 Pain in left foot: Secondary | ICD-10-CM

## 2020-12-05 DIAGNOSIS — M79671 Pain in right foot: Secondary | ICD-10-CM

## 2020-12-05 DIAGNOSIS — M79673 Pain in unspecified foot: Secondary | ICD-10-CM | POA: Insufficient documentation

## 2020-12-05 HISTORY — DX: Obesity, unspecified: E66.9

## 2020-12-05 HISTORY — DX: Obesity, class 1: E66.811

## 2020-12-05 MED ORDER — SEMAGLUTIDE(0.25 OR 0.5MG/DOS) 2 MG/1.5ML ~~LOC~~ SOPN: 0.2500 mg | PEN_INJECTOR | SUBCUTANEOUS | 1 refills | Status: DC

## 2020-12-05 NOTE — Assessment & Plan Note (Signed)
BP improved with reducing NSAID and tobacco use. Continue to monitor

## 2020-12-05 NOTE — Telephone Encounter (Signed)
Pt states she contacted her insurance and they will cover ozempic as long as it is a generic. Otherwise, she would like for you to send in the alternative if it is not a generic. [facility] be sent to [facility]

## 2020-12-05 NOTE — Telephone Encounter (Signed)
Medication sent to pharmacy,   Please advise pt to mychart or call with update in 3 weeks if tolerating to plan for dose increase.   And to schedule f/u appt in 3 months

## 2020-12-05 NOTE — Progress Notes (Signed)
Subjective:     Traci Oliver is a 55 y.o. female presenting for Obesity     HPI  #HTN - not exercising - has been trying to watch her diet - limiting breads, soda  #Tobacco use - continues to cut back - rare smoking  #depression/anxiety - quit prozac  - feeling well  #Heel pain - stopped naproxen due to heartburn - now will get pain the back of the foot with swelling - swelling improves - plantar fasciitis - is stretching and doing home PT   Review of Systems   Social History   Tobacco Use  Smoking Status Light Smoker   Packs/day: 0.25   Years: 35.00   Pack years: 8.75   Types: Cigarettes  Smokeless Tobacco Never  Tobacco Comments   only a few cig per day        Objective:    BP Readings from Last 3 Encounters:  12/05/20 118/76  07/12/20 (!) 128/94  06/14/20 (!) 142/100   Wt Readings from Last 3 Encounters:  12/05/20 178 lb (80.7 kg)  11/14/20 174 lb 9.6 oz (79.2 kg)  07/12/20 174 lb (78.9 kg)    BP 118/76   Pulse 81   Temp (!) 97.3 F (36.3 C) (Temporal)   Ht '5\' 3"'$  (1.6 m)   Wt 178 lb (80.7 kg)   SpO2 97%   BMI 31.53 kg/m    Physical Exam Exam conducted with a chaperone present.  Constitutional:      General: She is not in acute distress.    Appearance: She is well-developed. She is not diaphoretic.  HENT:     Right Ear: External ear normal.     Left Ear: External ear normal.     Nose: Nose normal.  Eyes:     Conjunctiva/sclera: Conjunctivae normal.  Cardiovascular:     Rate and Rhythm: Normal rate and regular rhythm.  Pulmonary:     Effort: Pulmonary effort is normal.  Genitourinary:    Vagina: Normal.     Comments: Hypopigmented area around 12 o'clock position on cervix Musculoskeletal:     Cervical back: Neck supple.  Skin:    General: Skin is warm and dry.     Capillary Refill: Capillary refill takes less than 2 seconds.  Neurological:     Mental Status: She is alert. Mental status is at baseline.   Psychiatric:        Mood and Affect: Mood normal.        Behavior: Behavior normal.          Assessment & Plan:   Problem List Items Addressed This Visit       Cardiovascular and Mediastinum   Essential hypertension - Primary    BP improved with reducing NSAID and tobacco use. Continue to monitor       Relevant Orders   Comprehensive metabolic panel     Other   Light tobacco smoker <10 cigarettes per day    No longer smoking daily! Congrats to patient. Continue the good efforts       Obesity (BMI 30.0-34.9)    Ozempic covered by insurance. Will start and check in monthly for dose increased. Encouraged healthy diet and regular exercise. Return 3 months       Relevant Medications   Semaglutide,0.25 or 0.'5MG'$ /DOS, 2 MG/1.5ML SOPN   Heel pain    Trial of volteran gel. If no improvement see Dr. Lorelei Pont with sports med.        Other Visit  Diagnoses     Cervical cancer screening       Relevant Orders   Cytology - PAP([place])   Mixed hyperlipidemia       Relevant Orders   Lipid panel        Return in about 3 months (around 03/07/2021) for weigh loss.  Lesleigh Noe, MD  This visit occurred during the SARS-CoV-2 public health emergency.  Safety protocols were in place, including screening questions prior to the visit, additional usage of staff PPE, and extensive cleaning of exam room while observing appropriate contact time as indicated for disinfecting solutions.

## 2020-12-05 NOTE — Assessment & Plan Note (Signed)
Ozempic covered by insurance. Will start and check in monthly for dose increased. Encouraged healthy diet and regular exercise. Return 3 months

## 2020-12-05 NOTE — Assessment & Plan Note (Signed)
Trial of volteran gel. If no improvement see Dr. Lorelei Pont with sports med.

## 2020-12-05 NOTE — Patient Instructions (Addendum)
Topical volteran gel for your heel pain If no improvement make appointment with Dr. Lorelei Pont for sports medicine   Check with insurance about Ozempic  If this is not covered we can plan to do phenteramine

## 2020-12-05 NOTE — Assessment & Plan Note (Signed)
No longer smoking daily! Congrats to patient. Continue the good efforts

## 2020-12-06 LAB — COMPREHENSIVE METABOLIC PANEL
ALT: 17 U/L (ref 0–35)
AST: 19 U/L (ref 0–37)
Albumin: 4.3 g/dL (ref 3.5–5.2)
Alkaline Phosphatase: 62 U/L (ref 39–117)
BUN: 15 mg/dL (ref 6–23)
CO2: 25 mEq/L (ref 19–32)
Calcium: 9.4 mg/dL (ref 8.4–10.5)
Chloride: 103 mEq/L (ref 96–112)
Creatinine, Ser: 0.99 mg/dL (ref 0.40–1.20)
GFR: 64.4 mL/min (ref >60.00)
Glucose, Bld: 104 mg/dL — ABNORMAL HIGH (ref 70–99)
Potassium: 3.5 mEq/L (ref 3.5–5.1)
Sodium: 139 mEq/L (ref 135–145)
Total Bilirubin: 0.3 mg/dL (ref 0.2–1.2)
Total Protein: 6.8 g/dL (ref 6.0–8.3)

## 2020-12-06 LAB — LIPID PANEL
Cholesterol: 235 mg/dL — ABNORMAL HIGH (ref 0–200)
HDL: 52.6 mg/dL (ref >39.00)
Total CHOL/HDL Ratio: 4
Triglycerides: 409 mg/dL — ABNORMAL HIGH (ref 0.0–149.0)

## 2020-12-06 LAB — LDL CHOLESTEROL, DIRECT: Direct LDL: 107 mg/dL

## 2020-12-07 ENCOUNTER — Telehealth: Payer: Self-pay | Admitting: Family Medicine

## 2020-12-07 DIAGNOSIS — E669 Obesity, unspecified: Secondary | ICD-10-CM

## 2020-12-07 MED ORDER — PHENTERMINE HCL 15 MG PO CAPS: 15.0000 mg | ORAL_CAPSULE | ORAL | 0 refills | Status: DC

## 2020-12-07 NOTE — Addendum Note (Signed)
Addended by: Waunita Schooner R on: 12/07/2020 02:01 PM   Modules accepted: Orders

## 2020-12-07 NOTE — Telephone Encounter (Signed)
Phentermine sent to pharmacy.   Please advise she will need 1 month f/u appointment

## 2020-12-07 NOTE — Telephone Encounter (Signed)
Mrs. Traci Oliver called in wanted to know if she could change the medication from ozempic to another medication due to the pharmacy stated that the ozempic is on back order and not sure on when they will have it in stock. She stated that Dr. Einar Pheasant stated that she would send in phenteramine.

## 2020-12-07 NOTE — Telephone Encounter (Signed)
Patient called and informed to follow up in 3 weeks by calling or through mychart. Patient is going to schedule an appointment in 3 months. Patient stated understanding.

## 2020-12-08 NOTE — Telephone Encounter (Signed)
Patient advised and follow up appointment made for 01/04/21

## 2020-12-10 LAB — CYTOLOGY - PAP
Comment: NEGATIVE
Diagnosis: NEGATIVE
High risk HPV: NEGATIVE

## 2020-12-22 ENCOUNTER — Telehealth: Payer: Self-pay

## 2020-12-22 NOTE — Telephone Encounter (Signed)
Hi Dr. Loletha Carrow,  Called pt to remind her of procedure on 8/23. Pt got appt dates mixed up, and needed to cancel. Will call at later time to reschedule.

## 2020-12-26 ENCOUNTER — Encounter: Payer: 59 | Admitting: Gastroenterology

## 2020-12-26 ENCOUNTER — Encounter: Payer: Self-pay | Admitting: Gastroenterology

## 2021-01-04 ENCOUNTER — Other Ambulatory Visit: Payer: Self-pay

## 2021-01-04 ENCOUNTER — Ambulatory Visit: Payer: 59 | Admitting: Family Medicine

## 2021-01-04 VITALS — BP 120/82 | HR 100 | Temp 97.9°F | Ht 63.0 in | Wt 171.8 lb

## 2021-01-04 DIAGNOSIS — K219 Gastro-esophageal reflux disease without esophagitis: Secondary | ICD-10-CM | POA: Diagnosis not present

## 2021-01-04 DIAGNOSIS — E669 Obesity, unspecified: Secondary | ICD-10-CM

## 2021-01-04 DIAGNOSIS — F17210 Nicotine dependence, cigarettes, uncomplicated: Secondary | ICD-10-CM | POA: Diagnosis not present

## 2021-01-04 MED ORDER — PHENTERMINE HCL 15 MG PO CAPS: 15.0000 mg | ORAL_CAPSULE | ORAL | 0 refills | Status: DC

## 2021-01-04 NOTE — Patient Instructions (Addendum)
Continue Phentermine  Return in 4 weeks for weight and blood pressure check  Please call the location of your choice from the menu below to schedule your Mammogram and/or Bone Density appointment.    [facility]                      Phone:  [phone], [place] [address]                                                             Services: Traditional and 3D Mammogram, Bone Density     Food Choices for Gastroesophageal Reflux Disease, Adult When you have gastroesophageal reflux disease (GERD), the foods you eat and your eating habits are very important. Choosing the right foods can help ease your discomfort. Think about working with a food expert (dietitian) to help you make good choices. What are tips for following this plan? Reading food labels Look for foods that are low in saturated fat. Foods that may help with your symptoms include: Foods that have less than 5% of daily value (DV) of fat. Foods that have 0 grams of trans fat. Cooking Do not fry your food. Cook your food by baking, steaming, grilling, or broiling. These are all methods that do not need a lot of fat for cooking. To add flavor, try to use herbs that are low in spice and acidity. Meal planning  Choose healthy foods that are low in fat, such as: Fruits and vegetables. Whole grains. Low-fat dairy products. Lean meats, fish, and poultry. Eat small meals often instead of eating 3 large meals each day. Eat your meals slowly in a place where you are relaxed. Avoid bending over or lying down until 2-3 hours after eating. Limit high-fat foods such as fatty meats or fried foods. Limit your intake of fatty foods, such as oils, butter, and shortening. Avoid the following as told by your doctor: Foods that cause symptoms. These may be different for different people. Keep a food diary to keep track of foods that cause  symptoms. Alcohol. Drinking a lot of liquid with meals. Eating meals during the 2-3 hours before bed. Lifestyle Stay at a healthy weight. Ask your doctor what weight is healthy for you. If you need to lose weight, work with your doctor to do so safely. Exercise for at least 30 minutes on 5 or more days each week, or as told by your doctor. Wear loose-fitting clothes. Do not smoke or use any products that contain nicotine or tobacco. If you need help quitting, ask your doctor. Sleep with the head of your bed higher than your feet. Use a wedge under the mattress or blocks under the bed frame to raise the head of the bed. Chew sugar-free gum after meals. What foods should eat? Eat a healthy, well-balanced diet of fruits, vegetables, whole grains, low-fat dairy products, lean meats, fish, and poultry. Each person is different. Foods that may cause symptoms in one person may not cause any symptoms in another person. Work with your doctor to find  foods that are safe for you. The items listed above may not be a complete list of what you can eat and drink. Contact a food expert for more options. What foods should I avoid? Limiting some of these foods may help in managing the symptoms of GERD. Everyone is different. Talk with a food expert or your doctor to help you find the exact foods to avoid, if any. Fruits Any fruits prepared with added fat. Any fruits that cause symptoms. For some people, this may include citrus fruits, such as oranges, grapefruit, pineapple, and lemons. Vegetables Deep-fried vegetables. [place] fries. Any vegetables prepared with added fat. Any vegetables that cause symptoms. For some people, this may include tomatoes and tomato products, chili peppers, onions and garlic, and horseradish. Grains Pastries or quick breads with added fat. Meats and other proteins High-fat meats, such as fatty beef or pork, hot dogs, ribs, ham, sausage, salami, and bacon. Fried meat or protein,  including fried fish and fried chicken. Nuts and nut butters, in large amounts. Dairy Whole milk and chocolate milk. Sour cream. Cream. Ice cream. Cream cheese. Milkshakes. Fats and oils Butter. Margarine. Shortening. Ghee. Beverages Coffee and tea, with or without caffeine. Carbonated beverages. Sodas. Energy drinks. Fruit juice made with acidic fruits, such as orange or grapefruit. Tomato juice. Alcoholic drinks. Sweets and desserts Chocolate and cocoa. Donuts. Seasonings and condiments Pepper. Peppermint and spearmint. Added salt. Any condiments, herbs, or seasonings that cause symptoms. For some people, this may include curry, hot sauce, or vinegar-based salad dressings. The items listed above may not be a complete list of what you should not eat and drink. Contact a food expert for more options. Questions to ask your doctor Diet and lifestyle changes are often the first steps that are taken to manage symptoms of GERD. If diet and lifestyle changes do not help, talk with your doctor about taking medicines. Where to find more information International Foundation for Gastrointestinal Disorders: aboutgerd.org Summary When you have GERD, food and lifestyle choices are very important in easing your symptoms. Eat small meals often instead of 3 large meals a day. Eat your meals slowly and in a place where you are relaxed. Avoid bending over or lying down until 2-3 hours after eating. Limit high-fat foods such as fatty meats or fried foods. This information is not intended to replace advice given to you by your health care provider. Make sure you discuss any questions you have with your health care provider. Document Revised: 11/01/2019 Document Reviewed: 11/01/2019 Elsevier Patient Education  [place].

## 2021-01-04 NOTE — Progress Notes (Signed)
Subjective:     Traci Oliver is a 55 y.o. female presenting for Follow-up (Weight loss/med management/WC: 44.25")     HPI  #obesity - has been taking phentermine - no side effects - sleep is good - down 7lbs - doing a lot of walking - diet is doing better  #indigestion - taking a pill she got from [employer] - omeprazole 20 mg (?) -     Review of Systems   Social History   Tobacco Use  Smoking Status Light Smoker   Packs/day: 0.25   Years: 35.00   Pack years: 8.75   Types: Cigarettes  Smokeless Tobacco Never  Tobacco Comments   only a few cig per day        Objective:    BP Readings from Last 3 Encounters:  01/04/21 120/82  12/05/20 118/76  07/12/20 (!) 128/94   Wt Readings from Last 3 Encounters:  01/04/21 171 lb 12 oz (77.9 kg)  12/05/20 178 lb (80.7 kg)  11/14/20 174 lb 9.6 oz (79.2 kg)    BP 120/82   Pulse 100   Temp 97.9 F (36.6 C) (Temporal)   Ht '5\' 3"'$  (1.6 m)   Wt 171 lb 12 oz (77.9 kg)   SpO2 98%   BMI 30.42 kg/m    Physical Exam Constitutional:      General: She is not in acute distress.    Appearance: She is well-developed. She is not diaphoretic.  HENT:     Right Ear: External ear normal.     Left Ear: External ear normal.  Eyes:     Conjunctiva/sclera: Conjunctivae normal.  Cardiovascular:     Rate and Rhythm: Normal rate and regular rhythm.     Heart sounds: No murmur heard. Pulmonary:     Effort: Pulmonary effort is normal. No respiratory distress.     Breath sounds: Normal breath sounds. No wheezing.  Musculoskeletal:     Cervical back: Neck supple.  Skin:    General: Skin is warm and dry.     Capillary Refill: Capillary refill takes less than 2 seconds.  Neurological:     Mental Status: She is alert. Mental status is at baseline.  Psychiatric:        Mood and Affect: Mood normal.        Behavior: Behavior normal.          Assessment & Plan:   Problem List Items Addressed This Visit        Digestive   GERD (gastroesophageal reflux disease)    Pt reports symptom control with omeprazole (?) but unable to stop w/o return. Diet instructions provide. Advised smoking cession. Also discussed risk of bone health with long term use and advised GI consult given inability to stop PPI therapy. She will try stopping omeprazole and diet changes if no improvement will reassess need for GI referral next month.         Other   Light tobacco smoker <10 cigarettes per day - Primary    Not ready to quit. Encouraged cessation. Discussed cancer risk.       Obesity (BMI 30.0-34.9)    Doing well. Weight down 7 lbs. Walking and eating well. Return 4 weeks. Cont phentermine 15 mg.       Relevant Medications   phentermine 15 MG capsule     Return in about 4 weeks (around 02/01/2021) for phentermine f/u.  Lesleigh Noe, MD  This visit occurred during the SARS-CoV-2 public health  emergency.  Safety protocols were in place, including screening questions prior to the visit, additional usage of staff PPE, and extensive cleaning of exam room while observing appropriate contact time as indicated for disinfecting solutions.

## 2021-01-04 NOTE — Assessment & Plan Note (Signed)
Pt reports symptom control with omeprazole (?) but unable to stop w/o return. Diet instructions provide. Advised smoking cession. Also discussed risk of bone health with long term use and advised GI consult given inability to stop PPI therapy. She will try stopping omeprazole and diet changes if no improvement will reassess need for GI referral next month.

## 2021-01-04 NOTE — Assessment & Plan Note (Addendum)
Doing well. Weight down 7 lbs. Walking and eating well. Return 4 weeks. Cont phentermine 15 mg.

## 2021-01-04 NOTE — Assessment & Plan Note (Signed)
Not ready to quit. Encouraged cessation. Discussed cancer risk.

## 2021-02-01 ENCOUNTER — Ambulatory Visit: Payer: 59 | Admitting: Family Medicine

## 2021-02-01 ENCOUNTER — Other Ambulatory Visit: Payer: Self-pay

## 2021-02-01 ENCOUNTER — Encounter: Payer: Self-pay | Admitting: Family Medicine

## 2021-02-01 DIAGNOSIS — E663 Overweight: Secondary | ICD-10-CM | POA: Insufficient documentation

## 2021-02-01 DIAGNOSIS — E669 Obesity, unspecified: Secondary | ICD-10-CM

## 2021-02-01 MED ORDER — PHENTERMINE HCL 15 MG PO CAPS: 15.0000 mg | ORAL_CAPSULE | ORAL | 0 refills | Status: DC

## 2021-02-01 NOTE — Progress Notes (Addendum)
   Subjective:     Traci Oliver is a 55 y.o. female presenting for Medication Refill     Medication Refill   #Weight loss  - continues to take phentermine - weight down another 4 lbs - waist today 42.5 inches  - eating ok - healthy choices  - no cp, palpitations    Review of Systems   Social History   Tobacco Use  Smoking Status Light Smoker   Packs/day: 0.25   Years: 35.00   Pack years: 8.75   Types: Cigarettes  Smokeless Tobacco Never  Tobacco Comments   only a few cig per day        Objective:    BP Readings from Last 3 Encounters:  02/01/21 126/84  01/04/21 120/82  12/05/20 118/76   Wt Readings from Last 3 Encounters:  02/01/21 167 lb (75.8 kg)  01/04/21 171 lb 12 oz (77.9 kg)  12/05/20 178 lb (80.7 kg)    BP 126/84   Pulse 78   Temp 97.6 F (36.4 C) (Temporal)   Ht 5\' 3"  (1.6 m)   Wt 167 lb (75.8 kg)   SpO2 96%   BMI 29.58 kg/m    Physical Exam Constitutional:      General: She is not in acute distress.    Appearance: She is well-developed. She is not diaphoretic.  HENT:     Right Ear: External ear normal.     Left Ear: External ear normal.  Eyes:     Conjunctiva/sclera: Conjunctivae normal.  Cardiovascular:     Rate and Rhythm: Normal rate and regular rhythm.     Heart sounds: No murmur heard. Pulmonary:     Effort: Pulmonary effort is normal. No respiratory distress.     Breath sounds: Normal breath sounds. No wheezing.  Musculoskeletal:     Cervical back: Neck supple.  Skin:    General: Skin is warm and dry.     Capillary Refill: Capillary refill takes less than 2 seconds.  Neurological:     Mental Status: She is alert. Mental status is at baseline.  Psychiatric:        Mood and Affect: Mood normal.        Behavior: Behavior normal.          Assessment & Plan:   Problem List Items Addressed This Visit       Other   Overweight (BMI 25.0-29.9)    C/b HTN, GERD, HLD. Waist down 44>42.5 inches. Weight down  another lbs.  Healthy eating and appetite suppression helping. Discussed that it is likely that in another month she will not be eligible. Discussed thinking about the calories and quantity she is eating now and continuing that.       Relevant Medications   phentermine 15 MG capsule     Return in about 4 weeks (around 03/01/2021).  Lesleigh Noe, MD  This visit occurred during the SARS-CoV-2 public health emergency.  Safety protocols were in place, including screening questions prior to the visit, additional usage of staff PPE, and extensive cleaning of exam room while observing appropriate contact time as indicated for disinfecting solutions.

## 2021-02-01 NOTE — Assessment & Plan Note (Addendum)
C/b HTN, GERD, HLD. Waist down 44>42.5 inches. Weight down another lbs.  Healthy eating and appetite suppression helping. Discussed that it is likely that in another month she will not be eligible. Discussed thinking about the calories and quantity she is eating now and continuing that.

## 2021-02-26 ENCOUNTER — Other Ambulatory Visit: Payer: Self-pay | Admitting: Family Medicine

## 2021-02-26 DIAGNOSIS — Z1231 Encounter for screening mammogram for malignant neoplasm of breast: Secondary | ICD-10-CM

## 2021-02-27 ENCOUNTER — Encounter: Payer: Self-pay | Admitting: Gastroenterology

## 2021-03-01 ENCOUNTER — Ambulatory Visit
Admission: RE | Admit: 2021-03-01 | Discharge: 2021-03-01 | Disposition: A | Discharge status: Home / Self Care | Payer: 59 | Source: Ambulatory Visit | Attending: Family Medicine | Admitting: Family Medicine

## 2021-03-01 ENCOUNTER — Other Ambulatory Visit: Payer: Self-pay

## 2021-03-01 DIAGNOSIS — Z1231 Encounter for screening mammogram for malignant neoplasm of breast: Secondary | ICD-10-CM

## 2021-03-05 ENCOUNTER — Ambulatory Visit: Payer: 59 | Admitting: Family Medicine

## 2021-03-05 ENCOUNTER — Encounter: Payer: Self-pay | Admitting: Family Medicine

## 2021-03-05 ENCOUNTER — Other Ambulatory Visit: Payer: Self-pay

## 2021-03-05 VITALS — BP 110/82 | HR 97 | Temp 96.4°F | Ht 63.0 in | Wt 165.4 lb

## 2021-03-05 DIAGNOSIS — N393 Stress incontinence (female) (male): Secondary | ICD-10-CM

## 2021-03-05 DIAGNOSIS — E663 Overweight: Secondary | ICD-10-CM | POA: Diagnosis not present

## 2021-03-05 MED ORDER — OXYBUTYNIN CHLORIDE ER 5 MG PO TB24: 5.0000 mg | ORAL_TABLET | Freq: Every day | ORAL | 3 refills | Status: DC

## 2021-03-05 MED ORDER — PHENTERMINE HCL 15 MG PO CAPS: 15.0000 mg | ORAL_CAPSULE | ORAL | 0 refills | Status: DC

## 2021-03-05 NOTE — Assessment & Plan Note (Signed)
Pt notes stress incontinence that she believes responded to oxybutynin in the past. Discussed kegel exercises and trial x 1-2 months. If no improvement would refer to urology for evaluation

## 2021-03-05 NOTE — Progress Notes (Signed)
Subjective:     Traci Oliver is a 56 y.o. female presenting for Follow-up (1 month- weight loss) and Medication Refill (Oxybutynin not on med list )     Medication Refill   #obesity - has continued to walk regularly - doing well with following healthy diet - avoiding bread, soda - medication is still working to suppress appetite  #stress incontinence - with coughing/sneezing - has not tried kegels - oxybutynin worked in the past - no constipation   Review of Systems   Social History   Tobacco Use  Smoking Status Light Smoker   Packs/day: 0.25   Years: 35.00   Pack years: 8.75   Types: Cigarettes  Smokeless Tobacco Never  Tobacco Comments   only a few cig per day        Objective:    BP Readings from Last 3 Encounters:  03/05/21 110/82  02/01/21 126/84  01/04/21 120/82   Wt Readings from Last 3 Encounters:  03/05/21 165 lb 6 oz (75 kg)  02/01/21 167 lb (75.8 kg)  01/04/21 171 lb 12 oz (77.9 kg)    BP 110/82   Pulse 97   Temp (!) 96.4 F (35.8 C) (Temporal)   Ht 5\' 3"  (1.6 m)   Wt 165 lb 6 oz (75 kg)   SpO2 96%   BMI 29.29 kg/m    Physical Exam Constitutional:      General: She is not in acute distress.    Appearance: She is well-developed. She is not diaphoretic.  HENT:     Right Ear: External ear normal.     Left Ear: External ear normal.  Eyes:     Conjunctiva/sclera: Conjunctivae normal.  Cardiovascular:     Rate and Rhythm: Normal rate and regular rhythm.  Pulmonary:     Effort: Pulmonary effort is normal. No respiratory distress.     Breath sounds: Normal breath sounds.  Musculoskeletal:     Cervical back: Neck supple.  Skin:    General: Skin is warm and dry.     Capillary Refill: Capillary refill takes less than 2 seconds.  Neurological:     Mental Status: She is alert. Mental status is at baseline.  Psychiatric:        Mood and Affect: Mood normal.        Behavior: Behavior normal.          Assessment & Plan:    Problem List Items Addressed This Visit       Other   Urinary incontinence - Primary    Pt notes stress incontinence that she believes responded to oxybutynin in the past. Discussed kegel exercises and trial x 1-2 months. If no improvement would refer to urology for evaluation      Relevant Medications   oxybutynin (DITROPAN-XL) 5 MG 24 hr tablet   Overweight (BMI 25.0-29.9)    Waist down 44>42.5>42. minimal weight loss since last visit. Discussed continuing for 1 month and then transitioning to healthy eating as BMI 29 today. Return in 4 months if gaining weight.       Relevant Medications   phentermine 15 MG capsule     Return in about 4 months (around 07/03/2021) for weight f/u.  Lesleigh Noe, MD  This visit occurred during the SARS-CoV-2 public health emergency.  Safety protocols were in place, including screening questions prior to the visit, additional usage of staff PPE, and extensive cleaning of exam room while observing appropriate contact time as indicated for disinfecting  solutions.

## 2021-03-05 NOTE — Patient Instructions (Signed)
Urinary Incontinence in Women  Risk Factors - Cannot change: age, pregnancy history, history of hysterectomy - Possible factors you can change: overall health, diuretic use, high impact exercise, weight   General Treatment  1) Appropriate Fluid intake - drink 50-70 oz daily, but spread out how the fluid is consumed 2) Constipation Management - make sure you get fiber in your diet and if constipated a stool softener 3) Weight loss (if overweight) 4) Pelvic Floor strengthening --  ---- Kegel Exercises (practice by holding urine during urination -- feel the pelvic floor muscle contraction). Perform 3 sets of 8-10 contractions working your way to holding each contraction for 10 seconds. Do for 15-20 weeks -----> If you have trouble doing this, try them laying down. Lay down on your back with your feet flat on the ground and knees bent.    Stress Incontinence - Cause: urethral sphincter weakness (muscle weakness) - Symptoms: loss of urine due to specific activities - like coughing, sneezing, exercise, laughing - Medications that can worse: decongestants - Treatment:  >> Kegel exercises or Physical therapy >> Medications: no FDA approved medications >> Surgery evaluation if not improved with physical therapy

## 2021-03-05 NOTE — Assessment & Plan Note (Signed)
Waist down 44>42.5>42. minimal weight loss since last visit. Discussed continuing for 1 month and then transitioning to healthy eating as BMI 29 today. Return in 4 months if gaining weight.

## 2021-03-07 ENCOUNTER — Other Ambulatory Visit: Payer: Self-pay | Admitting: Family Medicine

## 2021-03-16 ENCOUNTER — Telehealth (INDEPENDENT_AMBULATORY_CARE_PROVIDER_SITE_OTHER): Payer: 59 | Admitting: Family Medicine

## 2021-03-16 ENCOUNTER — Other Ambulatory Visit: Payer: Self-pay

## 2021-03-16 ENCOUNTER — Encounter: Payer: Self-pay | Admitting: Family Medicine

## 2021-03-16 VITALS — Ht 63.0 in

## 2021-03-16 DIAGNOSIS — J069 Acute upper respiratory infection, unspecified: Secondary | ICD-10-CM | POA: Diagnosis not present

## 2021-03-16 MED ORDER — BENZONATATE 200 MG PO CAPS: 200.0000 mg | ORAL_CAPSULE | Freq: Two times a day (BID) | ORAL | 0 refills | Status: DC | PRN

## 2021-03-16 NOTE — Progress Notes (Signed)
VIRTUAL VISIT Due to national recommendations of social distancing due to [place] 19, a virtual visit is felt to be most appropriate for this patient at this time.   I connected with the patient on 03/16/21 at  9:00 AM EST by virtual telehealth platform and verified that I am speaking with the correct person using two identifiers.   I discussed the limitations, risks, security and privacy concerns of performing an evaluation and management service by  virtual telehealth platform and the availability of in person appointments. I also discussed with the patient that there may be a patient responsible charge related to this service. The patient expressed understanding and agreed to proceed.  Patient location: Home Provider Location: [place] Participants: Eliezer Lofts and Blanch Media   Chief Complaint  Patient presents with   Sore Throat    Negative Covid Home Test last night   Generalized Body Aches   Ear Pain   Nasal Congestion   Cough    With chest tightness    History of Present Illness:  55 year old female patient of D. [facility]'s with history of HTN presents with new onset URI symptoms.   Date of Onset: 03/14/2021   She reports she started having ST,  hot and clammy, body aches, now has progressed to mild ear pain, nasal congestion, and  dry cough.   Mild DOE, none at rest, no wheeze.   She has tried theraflu and Administrator plus.   No fever.   No chronic lung disease.  COVID 19 screen COVID testing: neg home test 03/15/21 COVID vaccine: none. FLU vaccine: none COVID exposure: No recent travel or known exposure to [place] o FLU  The importance of social distancing was discussed today.    Review of Systems  Constitutional:  Negative for chills and fever.  HENT:  Positive for congestion and sore throat. Negative for ear pain.   Eyes:  Negative for pain and redness.  Respiratory:  Positive for cough.   Cardiovascular:  Negative for chest pain, palpitations  and leg swelling.  Gastrointestinal:  Negative for abdominal pain, blood in stool, constipation, diarrhea, nausea and vomiting.  Genitourinary:  Negative for dysuria.  Musculoskeletal:  Negative for falls and myalgias.  Skin:  Negative for rash.  Neurological:  Negative for dizziness.  Psychiatric/Behavioral:  Negative for depression. The patient is not nervous/anxious.      Past Medical History:  Diagnosis Date   Anemia    Anxiety    Cancer ([place])    ovarian CA- stage 1- no surgery needed   Depression    Obesity (BMI 30.0-34.9) 12/05/2020   44.25 inches Starting weight 178   Plantar fasciitis    Urinary, incontinence, stress female     reports that she has been smoking cigarettes. She has a 8.75 pack-year smoking history. She has never used smokeless tobacco. She reports current alcohol use. She reports that she does not use drugs.   Current Outpatient Medications:    naproxen (NAPROSYN) 500 MG tablet, TAKE 1 TABLET BY MOUTH 2 TIMES DAILY WITH A MEAL., Disp: 60 tablet, Rfl: 1   oxybutynin (DITROPAN-XL) 5 MG 24 hr tablet, Take 1 tablet (5 mg total) by mouth at bedtime., Disp: 30 tablet, Rfl: 3   phentermine 15 MG capsule, Take 1 capsule (15 mg total) by mouth every morning., Disp: 30 capsule, Rfl: 0   Observations/Objective: Height 5\' 3"  (1.6 m).  Physical Exam  Physical Exam Constitutional:      General: The patient  is not in acute distress. Pulmonary:     Effort: Pulmonary effort is normal. No respiratory distress.  Neurological:     Mental Status: The patient is alert and oriented to person, place, and time.  Psychiatric:        Mood and Affect: Mood normal.        Behavior: Behavior normal.   Assessment and Plan    Problem List Items Addressed This Visit     Viral URI with cough - Primary    Neg home COVID test.  Offered flu and COVID testing.. pt not interested. No s/s of bcaterial infeciton. Will treat with rest, fluids and symptomatic care.  Rec: Quit  smoking.  Returned precautions given.         Meds ordered this encounter  Medications   benzonatate (TESSALON) 200 MG capsule    Sig: Take 1 capsule (200 mg total) by mouth 2 (two) times daily as needed for cough.    Dispense:  20 capsule    Refill:  0    I discussed the assessment and treatment plan with the patient. The patient was provided an opportunity to ask questions and all were answered. The patient agreed with the plan and demonstrated an understanding of the instructions.   The patient was advised to call back or seek an in-person evaluation if the symptoms worsen or if the condition fails to improve as anticipated.     Eliezer Lofts, MD

## 2021-03-16 NOTE — Patient Instructions (Addendum)
For ST.[facility] Kitchen ibuprofen 800 mg three times daily.  Can use benzonate for daytime cough.  Rest, fluids.  Go to ER if severe shortness.

## 2021-03-16 NOTE — Assessment & Plan Note (Addendum)
Neg home COVID test.  Offered flu and COVID testing.. pt not interested. No s/s of bcaterial infeciton. Will treat with rest, fluids and symptomatic care.  Rec: Quit smoking.  Returned precautions given.

## 2021-04-04 ENCOUNTER — Other Ambulatory Visit: Payer: Self-pay | Admitting: Family Medicine

## 2021-04-04 DIAGNOSIS — E663 Overweight: Secondary | ICD-10-CM

## 2021-04-18 ENCOUNTER — Other Ambulatory Visit: Payer: Self-pay

## 2021-04-18 ENCOUNTER — Ambulatory Visit (AMBULATORY_SURGERY_CENTER): Payer: 59 | Admitting: *Deleted

## 2021-04-18 VITALS — Ht 63.0 in | Wt 165.0 lb

## 2021-04-18 DIAGNOSIS — Z1211 Encounter for screening for malignant neoplasm of colon: Secondary | ICD-10-CM

## 2021-04-18 NOTE — Progress Notes (Signed)
Patient's pre-visit was done today over the phone with the patient. Name,DOB and address verified. Patient denies any allergies to Eggs and Soy. Patient denies any problems with anesthesia/sedation. Patient is not taking any diet pills or blood thinners. No home Oxygen. Packet of Prep instructions mailed to patient including a copy of a consent form-pt is aware. Prep instructions sent to pt's MyChart (if activated).Patient understands to call us back with any questions or concerns. Patient is aware of our care-partner policy and [id number] safety protocol. Patient has suprep at home.   EMMI education assigned to the patient for the procedure, sent to [place].

## 2021-05-01 ENCOUNTER — Encounter: Payer: Self-pay | Admitting: Certified Registered Nurse Anesthetist

## 2021-05-02 ENCOUNTER — Encounter: Payer: 59 | Admitting: Gastroenterology

## 2021-05-02 ENCOUNTER — Telehealth: Payer: Self-pay | Admitting: Gastroenterology

## 2021-05-02 NOTE — Telephone Encounter (Signed)
Good Morning Dr. Loletha Carrow,  I called this patient at 7:25 this morning patient stated she called yesterday and has be exposed to covid. She will call back to reschedule.

## 2021-05-31 ENCOUNTER — Ambulatory Visit: Payer: Self-pay | Admitting: Family

## 2021-11-02 ENCOUNTER — Telehealth: Payer: Self-pay

## 2021-11-02 NOTE — Telephone Encounter (Signed)
Sent mychart to pt letting her know that she will need an appt for this.

## 2021-11-02 NOTE — Telephone Encounter (Signed)
MEDICATION: Naproxen (pt didn't know what dosage she was on)  PHARMACY: [id number] [place], [place] - [address]  Comments: Patient is completely out  **Let patient know to contact pharmacy at the end of the day to make sure medication is ready. **  ** Please notify patient to allow 48-72 hours to process**  **Encourage patient to contact the pharmacy for refills or they can request refills through [facility]*

## 2021-11-30 ENCOUNTER — Ambulatory Visit (INDEPENDENT_AMBULATORY_CARE_PROVIDER_SITE_OTHER): Payer: 59 | Admitting: Family Medicine

## 2021-11-30 ENCOUNTER — Encounter: Payer: Self-pay | Admitting: Family Medicine

## 2021-11-30 VITALS — BP 118/78 | HR 91 | Temp 97.4°F | Ht 62.5 in | Wt 167.1 lb

## 2021-11-30 DIAGNOSIS — M722 Plantar fascial fibromatosis: Secondary | ICD-10-CM | POA: Diagnosis not present

## 2021-11-30 DIAGNOSIS — I10 Essential (primary) hypertension: Secondary | ICD-10-CM | POA: Diagnosis not present

## 2021-11-30 DIAGNOSIS — Z1231 Encounter for screening mammogram for malignant neoplasm of breast: Secondary | ICD-10-CM

## 2021-11-30 DIAGNOSIS — Z0000 Encounter for general adult medical examination without abnormal findings: Secondary | ICD-10-CM | POA: Diagnosis not present

## 2021-11-30 DIAGNOSIS — Z1211 Encounter for screening for malignant neoplasm of colon: Secondary | ICD-10-CM

## 2021-11-30 DIAGNOSIS — E782 Mixed hyperlipidemia: Secondary | ICD-10-CM

## 2021-11-30 DIAGNOSIS — Z8041 Family history of malignant neoplasm of ovary: Secondary | ICD-10-CM

## 2021-11-30 LAB — CBC
HCT: 41.6 % (ref 36.0–46.0)
Hemoglobin: 13.8 g/dL (ref 12.0–15.0)
MCHC: 33.2 g/dL (ref 30.0–36.0)
MCV: 87.8 fl (ref 78.0–100.0)
Platelets: 174 10*3/uL (ref 150.0–400.0)
RBC: 4.74 Mil/uL (ref 3.87–5.11)
RDW: 12.9 % (ref 11.5–15.5)
WBC: 5.2 10*3/uL (ref 4.0–10.5)

## 2021-11-30 LAB — LIPID PANEL
Cholesterol: 217 mg/dL — ABNORMAL HIGH (ref 0–200)
HDL: 61.3 mg/dL (ref >39.00)
NonHDL: 155.82
Total CHOL/HDL Ratio: 4
Triglycerides: 245 mg/dL — ABNORMAL HIGH (ref 0.0–149.0)
VLDL: 49 mg/dL — ABNORMAL HIGH (ref 0.0–40.0)

## 2021-11-30 LAB — LDL CHOLESTEROL, DIRECT: Direct LDL: 109 mg/dL

## 2021-11-30 LAB — COMPREHENSIVE METABOLIC PANEL
ALT: 15 U/L (ref 0–35)
AST: 19 U/L (ref 0–37)
Albumin: 4.8 g/dL (ref 3.5–5.2)
Alkaline Phosphatase: 63 U/L (ref 39–117)
BUN: 15 mg/dL (ref 6–23)
CO2: 30 mEq/L (ref 19–32)
Calcium: 9.8 mg/dL (ref 8.4–10.5)
Chloride: 102 mEq/L (ref 96–112)
Creatinine, Ser: 1.09 mg/dL (ref 0.40–1.20)
GFR: 56.98 mL/min — ABNORMAL LOW (ref >60.00)
Glucose, Bld: 97 mg/dL (ref 70–99)
Potassium: 3.8 mEq/L (ref 3.5–5.1)
Sodium: 141 mEq/L (ref 135–145)
Total Bilirubin: 0.4 mg/dL (ref 0.2–1.2)
Total Protein: 7.1 g/dL (ref 6.0–8.3)

## 2021-11-30 MED ORDER — NAPROXEN 500 MG PO TABS: 500.0000 mg | ORAL_TABLET | Freq: Two times a day (BID) | ORAL | 1 refills | Status: DC

## 2021-11-30 NOTE — Assessment & Plan Note (Signed)
Uses naproxen approximately 2 times per week, refill provided.

## 2021-11-30 NOTE — Progress Notes (Signed)
Annual Exam   Chief Complaint:  Chief Complaint  Patient presents with   Annual Exam   Skin Problem    Rough places on sides of breasts    Medication Refill    Would like an Rx for Naproxen for foot pain     History of Present Illness:  [name] is a 56 y.o. [phone] who LMP was No LMP recorded. Patient is postmenopausal., presents today for her annual examination.    #Skin - rough skin - not itchy - not bothersome - will notice that it is there - brown discoloration on the the sides of the breast - left x 7 month - has nipple discharge on the right - every once in a while > 1 year  - no lumps or bumps in the breast  Nutrition She does get adequate calcium and Vitamin D in her diet. Diet: generally healthy Exercise: working all the time - walks at work 4 miles per day    Social History   Tobacco Use  Smoking Status Former   Packs/day: 0.10   Years: 35.00   Total pack years: 3.50   Types: Cigarettes   Quit date: 08/04/2021   Years since quitting: 0.3  Smokeless Tobacco Never  Tobacco Comments   1 cig every few days   Social History   Substance and Sexual Activity  Alcohol Use Not Currently   Social History   Substance and Sexual Activity  Drug Use No     General Health Dentist in the last year: Yes Eye doctor: no  Safety The patient wears seatbelts: yes.     The patient feels safe at home and in their relationships: yes.   Menstrual:  Symptoms of menopause: done  GYN She is not sexually active.    Cervical Cancer Screening (21-65):   Last Pap:   August 2022 Results were: no abnormalities /neg HPV DNA   Breast Cancer Screening (Age 24-74):  There is no FH of breast cancer. There is FH of ovarian cancer. BRCA screening discussed option.  Last Mammogram: 02/2021 The patient does want a mammogram this year.    Colon Cancer Screening:  Age 56-75 yo - benefits outweigh the risk. Adults 47-85 yo who have never been screened benefit.   Benefits: 134000 people in 2016 will be diagnosed and 49,000 will die - early detection helps [place]: Complications 2/2 to colonoscopy High Risk (Colonoscopy): genetic disorder (Lynch syndrome or familial adenomatous polyposis), personal hx of IBD, previous adenomatous polyp, or previous colorectal cancer, FamHx start 10 years before the age at diagnosis, increased in males and black race  Options:  FIT - looks for hemoglobin (blood in the stool) - specific and fairly sensitive - must be done annually Cologuard - looks for DNA and blood - more sensitive - therefore can have more false positives, every 3 years Colonoscopy - every 10 years if normal - sedation, bowl prep, must have someone drive you  Shared decision making and the patient had decided to do FOBT.   Social History   Tobacco Use  Smoking Status Former   Packs/day: 0.10   Years: 35.00   Total pack years: 3.50   Types: Cigarettes   Quit date: 08/04/2021   Years since quitting: 0.3  Smokeless Tobacco Never  Tobacco Comments   1 cig every few days    Lung Cancer Screening (Ages 91-47): not applicable 20 year pack history? No Current Tobacco user? No Quit less than 15 years ago? Yes  Interested in low dose CT for lung cancer screening? not applicable  Weight Wt Readings from Last 3 Encounters:  11/30/21 167 lb 2 oz (75.8 kg)  04/18/21 165 lb (74.8 kg)  03/05/21 165 lb 6 oz (75 kg)   Patient has high BMI  BMI Readings from Last 1 Encounters:  11/30/21 30.08 kg/m     Chronic disease screening Blood pressure monitoring:  BP Readings from Last 3 Encounters:  11/30/21 118/78  03/05/21 110/82  02/01/21 126/84    Lipid Monitoring: Indication for screening: age >55, obesity, diabetes, family hx, CV risk factors.  Lipid screening: Yes  Lab Results  Component Value Date   CHOL 235 (H) 12/05/2020   HDL 52.60 12/05/2020   LDLDIRECT 107.0 12/05/2020   TRIG (H) 12/05/2020    409.0 Triglyceride is over 400;  calculations on Lipids are invalid.   CHOLHDL 4 12/05/2020     Diabetes Screening: age >33, overweight, family hx, PCOS, hx of gestational diabetes, at risk ethnicity Diabetes Screening screening: Not Indicated  Lab Results  Component Value Date   HGBA1C 5.5 01/12/2020     Past Medical History:  Diagnosis Date   Anemia    Anxiety    Cancer ([place]) 1993   ovarian CA- stage 1- no surgery needed   Depression    Obesity (BMI 30.0-34.9) 12/05/2020   44.25 inches Starting weight 178   Plantar fasciitis    Urinary, incontinence, stress female     Past Surgical History:  Procedure Laterality Date   CESAREAN SECTION     TUBAL LIGATION      Prior to Admission medications   Medication Sig Start Date End Date Taking? Authorizing Provider  famotidine (PEPCID) 10 MG tablet Take 10 mg by mouth 2 (two) times daily.   Yes [provider]  GLUCOMANNAN PO Take by mouth.   Yes [provider]    No Known Allergies  Gynecologic History: No LMP recorded. Patient is postmenopausal.  Obstetric History: G2P1103  Social History   Socioeconomic History   Marital status: Single    Spouse name: Not on file   Number of children: 3   Years of education: high school    Highest education level: Not on file  Occupational History   Not on file  Tobacco Use   Smoking status: Former    Packs/day: 0.10    Years: 35.00    Total pack years: 3.50    Types: Cigarettes    Quit date: 08/04/2021    Years since quitting: 0.3   Smokeless tobacco: Never   Tobacco comments:    1 cig every few days  Vaping Use   Vaping Use: Every day  Substance and Sexual Activity   Alcohol use: Not Currently   Drug use: No   Sexual activity: Yes    Birth control/protection: Post-menopausal  Other Topics Concern   Not on file  Social History Narrative   07/12/20   From: the area   Living: alone   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 4  grandchildren - live nearby      Enjoys: spend time with grandchildren      Exercise: 13,000 steps at work   Diet: limiting carbs, no sodas, trying to eat healthy      Safety   Seat belts: Yes    Guns: No   Safe in relationships: Yes    Social Determinants of Health  Financial Resource Strain: Not on file  Food Insecurity: Not on file  Transportation Needs: Not on file  Physical Activity: Not on file  Stress: Not on file  Social Connections: Not on file  Intimate Partner Violence: Not on file    Family History  Problem Relation Age of Onset   Ovarian cancer Mother 71   Heart attack Father 84   Heart disease Father    Diabetes Maternal Grandmother    Hypertension Maternal Grandmother    Lung cancer Maternal Grandmother    Colon cancer Maternal Grandfather    Lung cancer Maternal Grandfather    Esophageal cancer Neg Hx    Rectal cancer Neg Hx    Stomach cancer Neg Hx     Review of Systems  Constitutional:  Negative for chills and fever.  HENT:  Negative for congestion and sore throat.   Eyes:  Negative for blurred vision and double vision.  Respiratory:  Negative for shortness of breath.   Cardiovascular:  Negative for chest pain.  Gastrointestinal:  Negative for heartburn, nausea and vomiting.  Genitourinary: Negative.   Musculoskeletal: Negative.  Negative for myalgias.  Skin:  Negative for rash.       Skin lesions on breast  Neurological:  Negative for dizziness and headaches.  Endo/Heme/Allergies:  Does not bruise/bleed easily.  Psychiatric/Behavioral:  Negative for depression. The patient is not nervous/anxious.      Physical Exam BP 118/78   Pulse 91   Temp (!) 97.4 F (36.3 C) (Temporal)   Ht 5' 2.5" (1.588 m)   Wt 167 lb 2 oz (75.8 kg)   SpO2 94%   BMI 30.08 kg/m    BP Readings from Last 3 Encounters:  11/30/21 118/78  03/05/21 110/82  02/01/21 126/84      Physical Exam Exam conducted with a chaperone present.  Constitutional:       General: She is not in acute distress.    Appearance: She is well-developed. She is not diaphoretic.  HENT:     Head: Normocephalic and atraumatic.     Right Ear: External ear normal.     Left Ear: External ear normal.     Nose: Nose normal.  Eyes:     General: No scleral icterus.    Extraocular Movements: Extraocular movements intact.     Conjunctiva/sclera: Conjunctivae normal.  Cardiovascular:     Rate and Rhythm: Normal rate and regular rhythm.     Heart sounds: No murmur heard. Pulmonary:     Effort: Pulmonary effort is normal. No respiratory distress.     Breath sounds: Normal breath sounds. No wheezing.  Chest:  Breasts:    Right: Normal. No inverted nipple, mass or tenderness.     Left: Normal. No inverted nipple, mass or tenderness.     Comments: Seborrheic keratosis on the left breast. Large freckle on the right breast.  Abdominal:     General: Bowel sounds are normal. There is no distension.     Palpations: Abdomen is soft. There is no mass.     Tenderness: There is no abdominal tenderness. There is no guarding or rebound.  Musculoskeletal:        General: Normal range of motion.     Cervical back: Neck supple.  Lymphadenopathy:     Cervical: No cervical adenopathy.  Skin:    General: Skin is warm and dry.     Capillary Refill: Capillary refill takes less than 2 seconds.  Neurological:     Mental Status: She  is alert and oriented to person, place, and time.     Deep Tendon Reflexes: Reflexes normal.  Psychiatric:        Mood and Affect: Mood normal.        Behavior: Behavior normal.     Results:  PHQ-9:  [place] Office Visit from 09/22/2019 in [place] at [place]  PHQ-9 Total Score 3         Assessment: 56 y.o. [phone] female here for routine annual physical examination.  Plan: Problem List Items Addressed This Visit       Cardiovascular and Mediastinum   Essential hypertension   Relevant Orders   Comprehensive metabolic  panel   CBC     Musculoskeletal and Integument   PLANTAR FASCIITIS, BILATERAL    Uses naproxen approximately 2 times per week, refill provided.      Relevant Medications   naproxen (NAPROSYN) 500 MG tablet     Other   Family history of ovarian cancer    Patient will consider whether or not she wants to do genetic screening, she does not believe this is covered by insurance.      Other Visit Diagnoses     Annual physical exam    -  Primary   Mixed hyperlipidemia       Relevant Orders   Lipid panel   Screening for colon cancer       Relevant Orders   Fecal occult blood, imunochemical   Encounter for screening mammogram for malignant neoplasm of breast       Relevant Orders   MM 3D SCREEN BREAST BILATERAL       Screening: -- Blood pressure screen normal -- cholesterol screening: will obtain -- Weight screening: overweight: continue to monitor -- Diabetes Screening: will obtain -- Nutrition: Encouraged healthy diet  The 10-year ASCVD risk score (Arnett DK, et al., 2019) is: 5.4%   Values used to calculate the score:     Age: 89 years     Sex: Female     Is Non-Hispanic African American: No     Diabetic: No     Tobacco smoker: Yes     Systolic Blood Pressure: 387 mmHg     Is BP treated: No     HDL Cholesterol: 52.6 mg/dL     Total Cholesterol: 235 mg/dL  -- Statin therapy for Age 83-75 with CVD risk >7.5%  Psych -- Depression screening (PHQ-9):  [place] Visit from 09/22/2019 in [facility] at [place]  PHQ-9 Total Score 3        Safety -- tobacco screening: not using -- alcohol screening:  low-risk usage. -- no evidence of domestic violence or intimate partner violence.   Cancer Screening -- pap smear not collected per ASCCP guidelines -- family history of breast cancer screening: mom with ovarian Ca pt will consider genetics referral -- Mammogram - ordered -- Colon cancer (age 73+)-- ordered  Immunizations Immunization  History  Administered Date(s) Administered   Td 08/27/2006    -- flu vaccine not in seaseon -- TDAP q10 years not up to date - pt will consider -- Shingles (age >50) not up to date - pt to consider -- Covid-19 Vaccine declined   Encouraged healthy diet and exercise. Encouraged regular vision and dental care.    Lesleigh Noe, MD

## 2021-11-30 NOTE — Assessment & Plan Note (Signed)
Patient will consider whether or not she wants to do genetic screening, she does not believe this is covered by insurance.

## 2021-12-03 ENCOUNTER — Telehealth: Payer: Self-pay | Admitting: Family Medicine

## 2021-12-03 NOTE — Telephone Encounter (Signed)
Patient has questions about recent lab results. Please call once reviewed at [phone]

## 2021-12-04 ENCOUNTER — Encounter: Payer: Self-pay | Admitting: Family Medicine

## 2021-12-04 DIAGNOSIS — I10 Essential (primary) hypertension: Secondary | ICD-10-CM

## 2021-12-04 NOTE — Telephone Encounter (Signed)
Patient called to get her lab results back.

## 2021-12-04 NOTE — Telephone Encounter (Signed)
Pt also sent mychart message and it was forwarded to Dr. Einar Pheasant.

## 2021-12-06 ENCOUNTER — Ambulatory Visit: Payer: 59 | Admitting: Nurse Practitioner

## 2021-12-06 NOTE — Telephone Encounter (Signed)
I spoke with Dr Einar Pheasant and she said pt would need to be seen if any appts available today. Pt said that she has dull lt side pain at waistline on and off and frequency of urine. Pt said when she lays on lt side she feels the dull pain; no pain now. Pt said no burning or pain upon urination and no back pain.pt said voiding normal amt each time. No blood seen and no fever. Pt scheduled appt with Romilda Garret NP 12/06/21 at 3PM with UC & ED precautions given and pt voiced understanding. Sending note to Romilda Garret NP, Anastasiya CMA.

## 2021-12-06 NOTE — Telephone Encounter (Signed)
Pt was scheduled to see Romilda Garret NP this afternoon at [facility] [address] and I see from past appt note that pt called and cancelled with reason pt could not make appt. I tried to call pt and no answer x 2 but left v/m requesting pt to call back about cancelling appt that was scheduled this afternoon and do you want to reschedule or did you go to UC or ED. Sending note to Romilda Garret NP, Dr Waunita Schooner and [place] CMA.

## 2021-12-06 NOTE — Telephone Encounter (Signed)
I do not have this patient on my schedule today

## 2021-12-14 ENCOUNTER — Other Ambulatory Visit (INDEPENDENT_AMBULATORY_CARE_PROVIDER_SITE_OTHER): Payer: 59

## 2021-12-14 ENCOUNTER — Telehealth: Payer: Self-pay | Admitting: Radiology

## 2021-12-14 DIAGNOSIS — R195 Other fecal abnormalities: Secondary | ICD-10-CM

## 2021-12-14 DIAGNOSIS — Z1211 Encounter for screening for malignant neoplasm of colon: Secondary | ICD-10-CM | POA: Diagnosis not present

## 2021-12-14 LAB — FECAL OCCULT BLOOD, IMMUNOCHEMICAL: Fecal Occult Bld: POSITIVE — AB

## 2021-12-14 NOTE — Telephone Encounter (Signed)
[facility] lab called a critical result, Positive ifob, results given to [facility] and sent to Dr Einar Pheasant

## 2021-12-14 NOTE — Telephone Encounter (Signed)
Please call patient and let her know her stool test shows blood. Please see where she would like to go for a GI referral and I will place one.

## 2021-12-17 NOTE — Addendum Note (Signed)
Addended by: Lesleigh Noe on: 12/17/2021 09:28 AM   Modules accepted: Orders

## 2021-12-17 NOTE — Telephone Encounter (Signed)
Spoke to pt and relayed positive Ifob results. Pt would like to see GI in [place].

## 2021-12-20 ENCOUNTER — Telehealth: Payer: Self-pay

## 2021-12-20 ENCOUNTER — Other Ambulatory Visit: Payer: Self-pay

## 2021-12-20 DIAGNOSIS — R195 Other fecal abnormalities: Secondary | ICD-10-CM

## 2021-12-20 MED ORDER — NA SULFATE-K SULFATE-MG SULF 17.5-3.13-1.6 GM/177ML PO SOLN: 354.0000 mL | Freq: Once | ORAL | 0 refills | Status: AC

## 2021-12-20 NOTE — Telephone Encounter (Signed)
Gastroenterology Pre-Procedure Review  Request Date: 02/04/2022 Requesting Physician: Dr. Marius Ditch  PATIENT REVIEW QUESTIONS: The patient responded to the following health history questions as indicated:    1. Are you having any GI issues? no 2. Do you have a personal history of Polyps? no 3. Do you have a family history of Colon Cancer or Polyps? Yes Maternal grandfather  4. Diabetes Mellitus? no 5. Joint replacements in the past 12 months?no 6. Major health problems in the past 3 months?no 7. Any artificial heart valves, MVP, or defibrillator?no    MEDICATIONS & ALLERGIES:    Patient reports the following regarding taking any anticoagulation/antiplatelet therapy:   Plavix, Coumadin, Eliquis, Xarelto, Lovenox, Pradaxa, Brilinta, or Effient? no Aspirin? no  Patient confirms/reports the following medications:  Current Outpatient Medications  Medication Sig Dispense Refill   famotidine (PEPCID) 10 MG tablet Take 10 mg by mouth 2 (two) times daily.     GLUCOMANNAN PO Take by mouth.     naproxen (NAPROSYN) 500 MG tablet Take 1 tablet (500 mg total) by mouth 2 (two) times daily with a meal. 60 tablet 1   No current facility-administered medications for this visit.    Patient confirms/reports the following allergies:  No Known Allergies  No orders of the defined types were placed in this encounter.   AUTHORIZATION INFORMATION Primary Insurance: 1D#: Group #:  Secondary Insurance: 1D#: Group #:  SCHEDULE INFORMATION: Date:  Time: Location:

## 2021-12-27 ENCOUNTER — Encounter: Payer: Self-pay | Admitting: Family Medicine

## 2021-12-27 NOTE — Telephone Encounter (Signed)
OK for Tdap  She can also get Shingles vaccine if desired.   Repeat BMP to check kidney function is ordered.

## 2021-12-28 NOTE — Telephone Encounter (Signed)
I called patient to schedule and she stated that she canceled her lab visit for Monday and that she's going to wait until she gets her new insurance to schedule,because she has the Friday insurance now. She also stated that she is looking to switch providers offices as well.

## 2021-12-31 ENCOUNTER — Other Ambulatory Visit: Payer: 59

## 2022-01-21 ENCOUNTER — Other Ambulatory Visit (INDEPENDENT_AMBULATORY_CARE_PROVIDER_SITE_OTHER): Payer: Commercial Managed Care - HMO

## 2022-01-21 DIAGNOSIS — I10 Essential (primary) hypertension: Secondary | ICD-10-CM | POA: Diagnosis not present

## 2022-01-21 LAB — BASIC METABOLIC PANEL
BUN: 15 mg/dL (ref 6–23)
CO2: 28 mEq/L (ref 19–32)
Calcium: 9.4 mg/dL (ref 8.4–10.5)
Chloride: 105 mEq/L (ref 96–112)
Creatinine, Ser: 0.81 mg/dL (ref 0.40–1.20)
GFR: 81.29 mL/min (ref >60.00)
Glucose, Bld: 107 mg/dL — ABNORMAL HIGH (ref 70–99)
Potassium: 3.9 mEq/L (ref 3.5–5.1)
Sodium: 141 mEq/L (ref 135–145)

## 2022-02-04 ENCOUNTER — Ambulatory Visit
Admission: RE | Admit: 2022-02-04 | Payer: Commercial Managed Care - HMO | Source: Home / Self Care | Admitting: Gastroenterology

## 2022-02-04 ENCOUNTER — Encounter: Admission: RE | Payer: Self-pay | Source: Home / Self Care

## 2022-02-04 SURGERY — COLONOSCOPY WITH PROPOFOL
Anesthesia: General

## 2022-03-04 ENCOUNTER — Ambulatory Visit
Admission: RE | Admit: 2022-03-04 | Discharge: 2022-03-04 | Disposition: A | Discharge status: Home / Self Care | Payer: Commercial Managed Care - HMO | Source: Ambulatory Visit | Attending: Family Medicine | Admitting: Family Medicine

## 2022-03-04 DIAGNOSIS — Z1231 Encounter for screening mammogram for malignant neoplasm of breast: Secondary | ICD-10-CM

## 2022-07-29 ENCOUNTER — Encounter: Payer: Self-pay | Admitting: Nurse Practitioner

## 2022-07-29 ENCOUNTER — Ambulatory Visit: Payer: 59 | Admitting: Nurse Practitioner

## 2022-07-29 VITALS — BP 122/80 | HR 87 | Temp 98.8°F | Resp 16 | Ht 62.5 in | Wt 176.1 lb

## 2022-07-29 DIAGNOSIS — I10 Essential (primary) hypertension: Secondary | ICD-10-CM

## 2022-07-29 DIAGNOSIS — M722 Plantar fascial fibromatosis: Secondary | ICD-10-CM

## 2022-07-29 DIAGNOSIS — K219 Gastro-esophageal reflux disease without esophagitis: Secondary | ICD-10-CM

## 2022-07-29 DIAGNOSIS — G4489 Other headache syndrome: Secondary | ICD-10-CM | POA: Insufficient documentation

## 2022-07-29 DIAGNOSIS — R21 Rash and other nonspecific skin eruption: Secondary | ICD-10-CM | POA: Diagnosis not present

## 2022-07-29 DIAGNOSIS — R051 Acute cough: Secondary | ICD-10-CM | POA: Diagnosis not present

## 2022-07-29 DIAGNOSIS — Z23 Encounter for immunization: Secondary | ICD-10-CM

## 2022-07-29 DIAGNOSIS — Z1211 Encounter for screening for malignant neoplasm of colon: Secondary | ICD-10-CM | POA: Diagnosis not present

## 2022-07-29 DIAGNOSIS — E669 Obesity, unspecified: Secondary | ICD-10-CM | POA: Diagnosis not present

## 2022-07-29 DIAGNOSIS — J069 Acute upper respiratory infection, unspecified: Secondary | ICD-10-CM | POA: Diagnosis not present

## 2022-07-29 LAB — POC COVID19 BINAXNOW: SARS Coronavirus 2 Ag: NEGATIVE

## 2022-07-29 MED ORDER — LEVOCETIRIZINE DIHYDROCHLORIDE 5 MG PO TABS: 5.0000 mg | ORAL_TABLET | Freq: Every evening | ORAL | 0 refills | Status: DC

## 2022-07-29 MED ORDER — NAPROXEN 500 MG PO TABS: 500.0000 mg | ORAL_TABLET | Freq: Two times a day (BID) | ORAL | 0 refills | Status: DC

## 2022-07-29 NOTE — Assessment & Plan Note (Signed)
COVID test negative in office.  Patient states her symptoms have been improving this is day 9 likely will continue to improve and will help.  Follow-up if no improvement

## 2022-07-29 NOTE — Assessment & Plan Note (Signed)
COVID test in office. 

## 2022-07-29 NOTE — Patient Instructions (Signed)
Nice to see you today Make an appointment to discuss weight loss options when ever you are ready  It will be time for your physical in approx 4.5 months Let me know if you do not improve

## 2022-07-29 NOTE — Assessment & Plan Note (Signed)
Hive-like in nature will prescribe oral second-generation antihistamine.

## 2022-07-29 NOTE — Assessment & Plan Note (Signed)
History of the same.  Patient uses naproxen as needed she would like a refill.  Refill provided

## 2022-07-29 NOTE — Assessment & Plan Note (Signed)
Patient currently on famotidine 10 mg daily.  Continue

## 2022-07-29 NOTE — Progress Notes (Signed)
Established Patient Office Visit  Subjective   Patient ID: Traci Oliver, female    DOB: [birth year]  Age: 57 y.o. MRN: [id number]  Chief Complaint  Patient presents with   Establish Care    Transferring from Dr. Einar Pheasant    Headache    X 6 days    Fatigue      Transfer of care: Last seen by Waunita Schooner, MD on 11/30/2021 Last CPE 11/30/2021  HTN: states that she does not check bP and has not been on blood pressure medication in the past.  GERD: Pepcid 10 mg daily. States that if she does not take it she knows it.    Tdap 2008?  Update today Flu: refused  Covid: refused  Shingles: Discussed in office PNA: Too young  Colonoscopy: was suppose to have one with positive iFOB but has not colonoscopy yet.  States she would prefer [place] location Mammogram: 03/04/2022, repeat in 1 year Pap Smear: 12/05/2020, repeat in 5 years   Headache/fatigue:  Symptoms started last Sunday. Monday she felt bad and was in the bed. States that she did feel achy, States that her head feels stuffy No covid and no flu vaccine States benadryl, cold and flu medicine, and sinus medicine that has helped the most.  States that she went to vacation and her grandson did not fell well    Review of Systems  Constitutional:  Positive for chills and malaise/fatigue. Negative for fever.  HENT:  Positive for ear pain. Negative for sore throat (improved).   Respiratory:  Positive for cough, sputum production (clear) and shortness of breath (DOE).   Gastrointestinal:  Negative for abdominal pain, constipation, diarrhea, nausea and vomiting.       BM daily   Genitourinary:  Negative for dysuria and hematuria.  Neurological:  Positive for headaches.  Psychiatric/Behavioral:  Negative for hallucinations and suicidal ideas.       Objective:     BP 122/80   Pulse 87   Temp 98.8 F (37.1 C)   Resp 16   Ht 5' 2.5" (1.588 m)   Wt 176 lb 2 oz (79.9 kg)   SpO2 98%   BMI 31.70 kg/m  BP Readings  from Last 3 Encounters:  07/29/22 122/80  11/30/21 118/78  03/05/21 110/82   Wt Readings from Last 3 Encounters:  07/29/22 176 lb 2 oz (79.9 kg)  11/30/21 167 lb 2 oz (75.8 kg)  04/18/21 165 lb (74.8 kg)      Physical Exam Vitals and nursing note reviewed.  Constitutional:      Appearance: Normal appearance.  HENT:     Right Ear: Tympanic membrane, ear canal and external ear normal.     Left Ear: Tympanic membrane, ear canal and external ear normal.     Nose:     Right Sinus: No maxillary sinus tenderness or frontal sinus tenderness.     Left Sinus: No maxillary sinus tenderness or frontal sinus tenderness.     Mouth/Throat:     Mouth: Mucous membranes are moist.     Pharynx: Oropharynx is clear.  Eyes:     Extraocular Movements: Extraocular movements intact.     Pupils: Pupils are equal, round, and reactive to light.     Comments: Wears glasses  Cardiovascular:     Rate and Rhythm: Normal rate and regular rhythm.     Pulses: Normal pulses.     Heart sounds: Normal heart sounds.  Pulmonary:     Effort: Pulmonary effort is  normal.     Breath sounds: Normal breath sounds.  Abdominal:     General: Bowel sounds are normal. There is no distension.     Palpations: There is no mass.     Tenderness: There is no abdominal tenderness.     Hernia: No hernia is present.  Musculoskeletal:     Right lower leg: No edema.     Left lower leg: No edema.  Lymphadenopathy:     Cervical: No cervical adenopathy.  Skin:    General: Skin is warm.     Findings: Rash present.       Neurological:     General: No focal deficit present.     Mental Status: She is alert.     Deep Tendon Reflexes:     Reflex Scores:      Bicep reflexes are 2+ on the right side and 2+ on the left side.      Patellar reflexes are 2+ on the right side and 2+ on the left side.    Comments: Bilateral upper and lower extremity strength 5/5  Psychiatric:        Mood and Affect: Mood normal.        Behavior:  Behavior normal.        Thought Content: Thought content normal.        Judgment: Judgment normal.      Results for orders placed or performed in visit on 07/29/22  POC COVID-19 BinaxNow  Result Value Ref Range   SARS Coronavirus 2 Ag Negative Negative      The 10-year ASCVD risk score (Arnett DK, et al., 2019) is: 4.6%    Assessment & Plan:   Problem List Items Addressed This Visit       Cardiovascular and Mediastinum   Essential hypertension    Blood pressure within normal limits.  Patient on antihypertensive medications.  Stable        Respiratory   Viral upper respiratory tract infection - Primary    COVID test negative in office.  Patient states her symptoms have been improving this is day 9 likely will continue to improve and will help.  Follow-up if no improvement        Digestive   GERD (gastroesophageal reflux disease)    Patient currently on famotidine 10 mg daily.  Continue        Musculoskeletal and Integument   PLANTAR FASCIITIS, BILATERAL    History of the same.  Patient uses naproxen as needed she would like a refill.  Refill provided      Relevant Medications   naproxen (NAPROSYN) 500 MG tablet   Rash    Hive-like in nature will prescribe oral second-generation antihistamine.      Relevant Medications   levocetirizine (XYZAL) 5 MG tablet     Other   Obesity (BMI 30.0-34.9)   Other headache syndrome    COVID test in office.  Drink plenty of fluids rest Tylenol as needed over-the-counter      Relevant Medications   naproxen (NAPROSYN) 500 MG tablet   Other Relevant Orders   POC COVID-19 BinaxNow (Completed)   Acute cough    COVID test in office      Relevant Orders   POC COVID-19 BinaxNow (Completed)   Other Visit Diagnoses     Screening for colon cancer       Relevant Orders   Ambulatory referral to Gastroenterology   Need for tetanus, diphtheria, and acellular pertussis (Tdap) vaccine  Relevant Orders   Tdap vaccine  greater than or equal to 7yo IM (Completed)       Return in about 18 weeks (around 12/02/2022) for CPE and Labs.    Romilda Garret, NP

## 2022-07-29 NOTE — Assessment & Plan Note (Signed)
COVID test in office.  Drink plenty of fluids rest Tylenol as needed over-the-counter

## 2022-07-29 NOTE — Assessment & Plan Note (Signed)
Blood pressure within normal limits.  Patient on antihypertensive medications.  Stable

## 2022-07-30 ENCOUNTER — Encounter: Payer: Self-pay | Admitting: Nurse Practitioner

## 2022-07-31 MED ORDER — AZITHROMYCIN 250 MG PO TABS: ORAL_TABLET | ORAL | 0 refills | Status: AC

## 2022-08-21 ENCOUNTER — Other Ambulatory Visit: Payer: Self-pay | Admitting: Nurse Practitioner

## 2022-08-21 DIAGNOSIS — R21 Rash and other nonspecific skin eruption: Secondary | ICD-10-CM

## 2022-10-03 ENCOUNTER — Ambulatory Visit: Payer: 59 | Admitting: Nurse Practitioner

## 2022-10-11 ENCOUNTER — Other Ambulatory Visit: Payer: Self-pay

## 2022-10-11 DIAGNOSIS — M722 Plantar fascial fibromatosis: Secondary | ICD-10-CM

## 2022-10-11 MED ORDER — NAPROXEN 500 MG PO TABS: 500.0000 mg | ORAL_TABLET | Freq: Two times a day (BID) | ORAL | 0 refills | Status: DC

## 2022-10-11 NOTE — Telephone Encounter (Signed)
naproxen (NAPROSYN) 500 MG tablet LOV 07/29/2022 NOV per Matt come back around 12/02/2022

## 2022-11-01 ENCOUNTER — Other Ambulatory Visit: Payer: Self-pay | Admitting: Nurse Practitioner

## 2022-11-01 ENCOUNTER — Encounter: Payer: Self-pay | Admitting: Nurse Practitioner

## 2022-11-01 ENCOUNTER — Ambulatory Visit (INDEPENDENT_AMBULATORY_CARE_PROVIDER_SITE_OTHER): Payer: 59 | Admitting: Nurse Practitioner

## 2022-11-01 VITALS — BP 118/82 | HR 90 | Temp 98.0°F | Resp 16 | Ht 62.5 in | Wt 177.2 lb

## 2022-11-01 DIAGNOSIS — E782 Mixed hyperlipidemia: Secondary | ICD-10-CM

## 2022-11-01 DIAGNOSIS — I10 Essential (primary) hypertension: Secondary | ICD-10-CM | POA: Diagnosis not present

## 2022-11-01 DIAGNOSIS — M79605 Pain in left leg: Secondary | ICD-10-CM | POA: Diagnosis not present

## 2022-11-01 DIAGNOSIS — E669 Obesity, unspecified: Secondary | ICD-10-CM

## 2022-11-01 DIAGNOSIS — Z6831 Body mass index (BMI) 31.0-31.9, adult: Secondary | ICD-10-CM | POA: Diagnosis not present

## 2022-11-01 DIAGNOSIS — M79604 Pain in right leg: Secondary | ICD-10-CM | POA: Diagnosis not present

## 2022-11-01 MED ORDER — WEGOVY 0.25 MG/0.5ML ~~LOC~~ SOAJ: 0.2500 mg | SUBCUTANEOUS | 0 refills | Status: DC

## 2022-11-01 NOTE — Assessment & Plan Note (Signed)
Patient is having struggles with weight loss.  She has done phentermine in the past.  Patient has fluctuated has gained 10 pounds since her last office visit last year.  Will start Wegovy 0.25 mg injection once weekly.  Patient will follow-up in 3 months.  Patient does have comorbidities of diagnosis of HTN, mixed hyperlipidemia and obesity.

## 2022-11-01 NOTE — Assessment & Plan Note (Signed)
Patient has found shoes that are comfortable and supportive this has helped encourage patient to use compression socks if she is going be on her feet for an extended period of time as this may help with the fatigability of her legs.  Did encourage patient to discontinue naproxen on a regular basis as she is been taking it twice a day for 2 weeks she can use Tylenol as needed and the naproxen on an as-needed basis.

## 2022-11-01 NOTE — Patient Instructions (Signed)
Nice to see you today Get some compression socks and see if that helps with the legs Follow up with me in 3 months for medication recheck and your physical, sooner if you need me

## 2022-11-01 NOTE — Progress Notes (Signed)
Acute Office Visit  Subjective:     Patient ID: Traci Oliver, female    DOB: 18-Oct-1965, 57 y.o.   MRN: [id number]  Chief Complaint  Patient presents with   Plantar Fasciitis   Weight Gain    Wants to talk about weight loss injection.   Urinary Incontinence    Wants to talk about getting oxybutynin 5 mg to help with bladder. When she sneezes or laughs she pee on her herself.    HPI Patient is in today for multiple complaints  Plantar fascitis: states that she has been dianosised with it in the past. Staets that she is not hurt in her feet. States that she has been going through shoes. States that her feet feel better. She has been wearting skecthers. States that bilateral posterior leg and knees are hurting. States that when she is off work and rested for a day she feels fine. States that she is standing the enitre time and walks 4000 steps a day. States that she is taking naprxoen twice a day. States that she has been doing that for the last 2 weeks. States that it does help   Urinary: oxybutynin in the past. States that she just started back taking it again. States that she has been taking it at night. States that it has help. States that when she sneezes, laughs, and coughs. States that she had left over prescription.  Weight gain: States that she feels like she cannot get the weight to go down. States that she has tried phentermine in the past. She is interested in the weight loss injections  Diet: states that she will eat 2 times a day and no snacks. States that she drinks water and coffee. States soda once every 2+ weeks.  States that she does walks 4000K at work. States that she is active outside of work but no defined exercise   Review of Systems  Constitutional:  Negative for chills and fever.  Respiratory:  Negative for shortness of breath.   Cardiovascular:  Negative for chest pain.  Musculoskeletal:  Positive for joint pain and myalgias.        Objective:     BP 118/82   Pulse 90   Temp 98 F (36.7 C)   Resp 16   Ht 5' 2.5" (1.588 m)   Wt 177 lb 4 oz (80.4 kg)   SpO2 96%   BMI 31.90 kg/m  BP Readings from Last 3 Encounters:  11/01/22 118/82  07/29/22 122/80  11/30/21 118/78   Wt Readings from Last 3 Encounters:  11/01/22 177 lb 4 oz (80.4 kg)  07/29/22 176 lb 2 oz (79.9 kg)  11/30/21 167 lb 2 oz (75.8 kg)      Physical Exam Vitals and nursing note reviewed.  Constitutional:      Appearance: Normal appearance.  Cardiovascular:     Rate and Rhythm: Normal rate and regular rhythm.     Pulses:          Posterior tibial pulses are 2+ on the right side and 2+ on the left side.     Heart sounds: Normal heart sounds.  Pulmonary:     Effort: Pulmonary effort is normal.     Breath sounds: Normal breath sounds.  Musculoskeletal:     Right lower leg: No edema.     Left lower leg: No edema.     Comments: Bilateral calves supple and non tender to palpation No varicose veins appreciated   Neurological:  Mental Status: She is alert.     No results found for any visits on 11/01/22.      Assessment & Plan:   Problem List Items Addressed This Visit       Cardiovascular and Mediastinum   Essential hypertension   Relevant Medications   Semaglutide-Weight Management (WEGOVY) 0.25 MG/0.5ML SOAJ     Other   Obesity (BMI 30-39.9) - Primary    Patient is having struggles with weight loss.  She has done phentermine in the past.  Patient has fluctuated has gained 10 pounds since her last office visit last year.  Will start Wegovy 0.25 mg injection once weekly.  Patient will follow-up in 3 months.  Patient does have comorbidities of diagnosis of HTN, mixed hyperlipidemia and obesity.      Relevant Medications   Semaglutide-Weight Management (WEGOVY) 0.25 MG/0.5ML SOAJ   Mixed hyperlipidemia   Relevant Medications   Semaglutide-Weight Management (WEGOVY) 0.25 MG/0.5ML SOAJ   Bilateral leg pain    Patient has found shoes that  are comfortable and supportive this has helped encourage patient to use compression socks if she is going be on her feet for an extended period of time as this may help with the fatigability of her legs.  Did encourage patient to discontinue naproxen on a regular basis as she is been taking it twice a day for 2 weeks she can use Tylenol as needed and the naproxen on an as-needed basis.       Meds ordered this encounter  Medications   Semaglutide-Weight Management (WEGOVY) 0.25 MG/0.5ML SOAJ    Sig: Inject 0.25 mg into the skin once a week.    Dispense:  2 mL    Refill:  0    Order Specific Question:   Supervising Provider    Answer:   Roxy Manns A [[id number]]    Return in about 3 months (around 02/01/2023) for CPE and Labs/ weogvy recheck .  Audria Nine, NP

## 2022-11-02 ENCOUNTER — Other Ambulatory Visit: Payer: Self-pay | Admitting: Nurse Practitioner

## 2022-11-04 ENCOUNTER — Other Ambulatory Visit: Payer: Self-pay

## 2022-11-04 ENCOUNTER — Telehealth: Payer: Self-pay | Admitting: Nurse Practitioner

## 2022-11-04 DIAGNOSIS — E669 Obesity, unspecified: Secondary | ICD-10-CM

## 2022-11-04 MED ORDER — PHENTERMINE HCL 15 MG PO CAPS: 15.0000 mg | ORAL_CAPSULE | ORAL | 0 refills | Status: DC

## 2022-11-04 MED ORDER — FAMOTIDINE 10 MG PO TABS
10.0000 mg | ORAL_TABLET | Freq: Two times a day (BID) | ORAL | 0 refills | Status: DC
  Filled 2022-11-04: qty 30, 15d supply, fill #0

## 2022-11-04 NOTE — Telephone Encounter (Signed)
Patient called in stating that insurance is not going to coverSemaglutide-Weight Management (WEGOVY) 0.25 MG/0.5ML SOAJ    ,she said that [facility] said that he would send in an alternative if her insurance did not cover.

## 2022-11-04 NOTE — Telephone Encounter (Signed)
Sending note to Audria Nine NP.

## 2022-11-04 NOTE — Telephone Encounter (Signed)
Pt called returning missed call. Told pt Cable's response. Pt stated she'd call back to schedule f/u. Call back # [phone]

## 2022-11-04 NOTE — Telephone Encounter (Signed)
Pt called in asking since her insurance won't cover [phone], can Cable prescribe some meds in a pill form? Call back # [id number] - [place], [place] - [address]. Call back # [phone]

## 2022-11-04 NOTE — Telephone Encounter (Signed)
We did discuss this. I will send in phentermine 15 mg and she will need an office visit in 1 month for a recheck

## 2022-11-04 NOTE — Telephone Encounter (Signed)
Called patient advised that she should call insurance and see what is covered. Patient states you had talked about calling in phentermine that she could get out of pocket. She would like to have that called in to local pharmacy.

## 2022-11-04 NOTE — Telephone Encounter (Signed)
Left message to return call to our office.  

## 2022-12-02 ENCOUNTER — Ambulatory Visit (INDEPENDENT_AMBULATORY_CARE_PROVIDER_SITE_OTHER): Payer: 59 | Admitting: Nurse Practitioner

## 2022-12-02 ENCOUNTER — Ambulatory Visit (INDEPENDENT_AMBULATORY_CARE_PROVIDER_SITE_OTHER)
Admission: RE | Admit: 2022-12-02 | Discharge: 2022-12-02 | Disposition: A | Discharge status: Home / Self Care | Payer: 59 | Source: Ambulatory Visit | Attending: Nurse Practitioner | Admitting: Nurse Practitioner

## 2022-12-02 VITALS — BP 120/82 | HR 92 | Temp 98.6°F | Ht 62.5 in | Wt 169.0 lb

## 2022-12-02 DIAGNOSIS — E669 Obesity, unspecified: Secondary | ICD-10-CM

## 2022-12-02 DIAGNOSIS — M79671 Pain in right foot: Secondary | ICD-10-CM | POA: Diagnosis not present

## 2022-12-02 DIAGNOSIS — I10 Essential (primary) hypertension: Secondary | ICD-10-CM | POA: Diagnosis not present

## 2022-12-02 DIAGNOSIS — M7661 Achilles tendinitis, right leg: Secondary | ICD-10-CM | POA: Diagnosis not present

## 2022-12-02 MED ORDER — PHENTERMINE HCL 15 MG PO CAPS
15.0000 mg | ORAL_CAPSULE | ORAL | 0 refills | Status: DC
Start: 2022-12-02 — End: 2022-12-30

## 2022-12-02 NOTE — Assessment & Plan Note (Signed)
Patient tolerated phentermine 15 mg daily well.  She has had an appreciable weight loss.  Continue medication for 2 more months

## 2022-12-02 NOTE — Assessment & Plan Note (Signed)
Blood pressure well-controlled while on phentermine.  Continue lifestyle modifications

## 2022-12-02 NOTE — Patient Instructions (Signed)
Nice to see you today Work on increasing your water intake I will be in touch with the xray once I have reviewed it

## 2022-12-02 NOTE — Assessment & Plan Note (Signed)
Ambiguous in nature.  Query foreign body in foot pending x-ray today

## 2022-12-02 NOTE — Progress Notes (Signed)
Established Patient Office Visit  Subjective   Patient ID: Traci Oliver, female    DOB: November 30, 1965  Age: 57 y.o. MRN: [id number]  Chief Complaint  Patient presents with   Obesity    Follow up on Phentermine. Down 8lbs. Has noticed increased energy.    Foot Pain    Right foot        Obesity: patient was place on phenteramine 15 mg and  has lost 8 pounds. Ststes that she has more energy now. She has been working hard on her diet. States she has cut breads and soft drinks. States that she walks 5000 a day at work and working in the yard.   Foot pain: state that it is her right foot. States that it hurts in the heel area. States that she did notice a spot on her foot that has been present for 4-5 moths. No injury. States that it does not hurt wit rest. States worse with movement. States that it will start after 20 mins on use. State hurting in the heel and going up the leg. States that she has been using a cream on it. Pain releif cream that is Camphor and menthol that does help. States that she will do ibuprofen on occasion that will help    Review of Systems  Constitutional:  Negative for chills and fever.  Respiratory:  Negative for shortness of breath.   Cardiovascular:  Negative for chest pain.  Neurological:  Negative for headaches.  Psychiatric/Behavioral:  Negative for hallucinations and suicidal ideas.       Objective:     BP 120/82   Pulse 92   Temp 98.6 F (37 C) (Temporal)   Ht 5' 2.5" (1.588 m)   Wt 169 lb (76.7 kg)   SpO2 98%   BMI 30.42 kg/m  BP Readings from Last 3 Encounters:  12/02/22 120/82  11/01/22 118/82  07/29/22 122/80   Wt Readings from Last 3 Encounters:  12/02/22 169 lb (76.7 kg)  11/01/22 177 lb 4 oz (80.4 kg)  07/29/22 176 lb 2 oz (79.9 kg)      Physical Exam Vitals and nursing note reviewed.  Constitutional:      Appearance: Normal appearance.  Cardiovascular:     Rate and Rhythm: Normal rate and regular rhythm.     Heart  sounds: Normal heart sounds.  Pulmonary:     Effort: Pulmonary effort is normal.     Breath sounds: Normal breath sounds.  Musculoskeletal:        General: Tenderness present.       Legs:     Comments: Painful to palpation   Neurological:     Mental Status: She is alert.      No results found for any visits on 12/02/22.    The 10-year ASCVD risk score (Arnett DK, et al., 2019) is: 2.1%    Assessment & Plan:   Problem List Items Addressed This Visit       Cardiovascular and Mediastinum   Essential hypertension    Blood pressure well-controlled while on phentermine.  Continue lifestyle modifications        Other   Obesity (BMI 30-39.9) - Primary    Patient tolerated phentermine 15 mg daily well.  She has had an appreciable weight loss.  Continue medication for 2 more months      Relevant Medications   phentermine 15 MG capsule   Right foot pain    Ambiguous in nature.  Query foreign body in  foot pending x-ray today      Relevant Orders   DG Foot Complete Right    Return in about 4 weeks (around 12/30/2022) for Medication/weight loss recheck .    Audria Nine, NP

## 2022-12-04 ENCOUNTER — Encounter (INDEPENDENT_AMBULATORY_CARE_PROVIDER_SITE_OTHER): Payer: Self-pay

## 2022-12-09 ENCOUNTER — Encounter: Payer: Self-pay | Admitting: Nurse Practitioner

## 2022-12-11 NOTE — Progress Notes (Signed)
Traci Enck T. Shiloh Swopes, MD, CAQ Sports Medicine [facility] [address] [place], [address]  Phone: [phone]  FAX: [phone]  Traci Oliver - 57 y.o. female  MRN [id number]  Date of Birth: [birth year]  Date: 12/12/2022  PCP: Eden Emms, NP  Referral: Eden Emms, NP  Chief Complaint  Patient presents with   Foot Pain    Right Heel   Subjective:   Traci Oliver is a 57 y.o. very pleasant female patient with Body mass index is 30.31 kg/m. who presents with the following:  Patient presents with some ongoing heel pain.  I did review her foot x-rays, she does have some calcification at the plantar fascia insertion as well as the Achilles insertion.  She has some ongoing right-sided foot pain, and she has had ongoing issues with plantar fasciitis for multiple years - up to 10 years off and on.  Has had some PF for about six months, when her plantar fasciitis recently started to flareup.  Went through a bunch of different shoes.  She has been unable to find some tennis shoes that she thinks provide some significant relief.  In the mornings, will do some stretching in the morning.  If does not do it, it will be hard to walk.  Some days when she works it will really hurt a lot.   Has a foot massager, and this seems to help.  Review of Systems is noted in the HPI, as appropriate  Objective:   BP 120/80 (BP Location: Left Arm, Patient Position: Sitting, Cuff Size: Normal)   Pulse 83   Temp (!) 97 F (36.1 C) (Temporal)   Ht 5' 2.5" (1.588 m)   Wt 168 lb 6 oz (76.4 kg)   SpO2 97%   BMI 30.31 kg/m   GEN: No acute distress; alert,appropriate. PULM: Breathing comfortably in no respiratory distress PSYCH: Normally interactive.    Foot exam: R Echymosis: no Edema: no ROM: full LE B Gait: heel toe, non-antalgic MT pain: no Callus pattern: none Lateral Mall: NT Medial Mall: NT Talus: NT Navicular:  NT Calcaneous: NT Metatarsals: NT 5th MT: NT Phalanges: NT Achilles: NT Plantar Fascia: tender, medial along PF. Pain with forced dorsi Fat Pad: NT Peroneals: NT Post Tib: NT Great Toe: Nml motion Ant Drawer: neg Other foot breakdown: none Long arch: preserved - significant pes cavus foot B Transverse arch: preserved Hindfoot breakdown: none Sensation: intact   Laboratory and Imaging Data:  Assessment and Plan:     ICD-10-CM   1. Plantar fasciitis, right  M72.2     2. Right foot pain  M79.671 triamcinolone acetonide (KENALOG-40) injection 40 mg     History of plantar fasciitis off-and-on for 10 years, and her most recent flareup has been roughly 6 months.  I reviewed her current program.  I also added in a few other stretches that would isolate the plantar fascia she can work on her own.  I gave her an arch binder and also recommended that she try some Tuli's heel cups.  Given her level of pain and dysfunction, we are going to do a plantar fascia injection today. Social: Right now this is limiting her ability to maximally walk  Plantar Fascia Injection Procedure Note Traci Oliver March 06, 1966 Date of procedure: 12/12/2022  Procedure: CPT Code 01027, Injection(s); single tendon sheath, or ligament, aponeurosis, R (eg, plantar fascia) Indications: Pain  Procedure Details Verbal consent obtained. Risks including risk of  rupture, hypopigmentation reviewed in addition to benefits and alternatives were reviewed. Chloraprep used for prep. Ethyl Chloride used for anesthesia. Under sterile conditions, using the medial approach 2 cc of Lidocaine 1% and Kenalog 40 mg injected superior to plantar fascia and fanned. No compications. Decreased pain post-injection. Medication: 1 cc of Kenalog 40 mg (equaling Kenalog 20 mg)   Patient Instructions  Please read handouts on Plantar Fascitis.  STRETCHING and Strengthening program critically important.  Strengthening on foot and  calf muscles as seen in handout. Calf raises, 2 legged, then 1 legged. Foot massage with tennis ball. Ice massage.  Towel Scrunches: get a towel or hand towel, use toes to pick up and scrunch up the towel.  Marble pick-ups, practice picking up marbles with toes and placing into a cup  NEEDS TO BE DONE EVERY DAY  Recommended over the counter insoles. (Spenco or Hapad)  A rigid shoe with good arch support helps: Dansko (great), Randel Pigg, Merrell No easily bendable shoes.   Tuli's heel cups - may have to get on [employer]    Medication Management during today's office visit: Meds ordered this encounter  Medications   triamcinolone acetonide (KENALOG-40) injection 40 mg   Medications Discontinued During This Encounter  Medication Reason   famotidine (PEPCID) 10 MG tablet Completed Course   GLUCOMANNAN PO Completed Course    Orders placed today for conditions managed today: No orders of the defined types were placed in this encounter.   Disposition: No follow-ups on file.  [employer] One speech-to-text software was used for transcription in this dictation.  Possible transcriptional errors can occur using Animal nutritionist.   Signed,  Elpidio Galea. Latrelle Fuston, MD   Outpatient Encounter Medications as of 12/12/2022  Medication Sig   levocetirizine (XYZAL) 5 MG tablet TAKE 1 TABLET BY MOUTH EVERY DAY IN THE EVENING   naproxen (NAPROSYN) 500 MG tablet Take 1 tablet (500 mg total) by mouth 2 (two) times daily with a meal.   phentermine 15 MG capsule Take 1 capsule (15 mg total) by mouth every morning.   [DISCONTINUED] famotidine (PEPCID) 10 MG tablet Take 1 tablet (10 mg total) by mouth 2 (two) times daily. (Patient not taking: Reported on 12/02/2022)   [DISCONTINUED] GLUCOMANNAN PO Take by mouth. (Patient not taking: Reported on 07/29/2022)   [EXPIRED] triamcinolone acetonide (KENALOG-40) injection 40 mg    No facility-administered encounter medications on file as of 12/12/2022.

## 2022-12-12 ENCOUNTER — Ambulatory Visit (INDEPENDENT_AMBULATORY_CARE_PROVIDER_SITE_OTHER): Payer: 59 | Admitting: Family Medicine

## 2022-12-12 ENCOUNTER — Encounter: Payer: Self-pay | Admitting: Family Medicine

## 2022-12-12 VITALS — BP 120/80 | HR 83 | Temp 97.0°F | Ht 62.5 in | Wt 168.4 lb

## 2022-12-12 DIAGNOSIS — M79671 Pain in right foot: Secondary | ICD-10-CM

## 2022-12-12 DIAGNOSIS — M722 Plantar fascial fibromatosis: Secondary | ICD-10-CM | POA: Diagnosis not present

## 2022-12-12 MED ORDER — TRIAMCINOLONE ACETONIDE 40 MG/ML IJ SUSP
40.0000 mg | Freq: Once | INTRAMUSCULAR | Status: AC
Start: 2022-12-12 — End: 2022-12-12
  Administered 2022-12-12: 40 mg via INTRA_ARTICULAR

## 2022-12-12 NOTE — Patient Instructions (Signed)
Please read handouts on Plantar Fascitis.  STRETCHING and Strengthening program critically important.  Strengthening on foot and calf muscles as seen in handout. Calf raises, 2 legged, then 1 legged. Foot massage with tennis ball. Ice massage.  Towel Scrunches: get a towel or hand towel, use toes to pick up and scrunch up the towel.  Marble pick-ups, practice picking up marbles with toes and placing into a cup  NEEDS TO BE DONE EVERY DAY  Recommended over the counter insoles. (Spenco or Hapad)  A rigid shoe with good arch support helps: Dansko (great), Randel Pigg, Merrell No easily bendable shoes.   Tuli's heel cups - may have to get on [employer]

## 2022-12-30 ENCOUNTER — Ambulatory Visit (INDEPENDENT_AMBULATORY_CARE_PROVIDER_SITE_OTHER): Payer: 59 | Admitting: Nurse Practitioner

## 2022-12-30 ENCOUNTER — Encounter: Payer: Self-pay | Admitting: Nurse Practitioner

## 2022-12-30 VITALS — BP 126/86 | HR 90 | Temp 97.9°F | Ht 62.5 in | Wt 165.0 lb

## 2022-12-30 DIAGNOSIS — Z1211 Encounter for screening for malignant neoplasm of colon: Secondary | ICD-10-CM

## 2022-12-30 DIAGNOSIS — E785 Hyperlipidemia, unspecified: Secondary | ICD-10-CM | POA: Diagnosis not present

## 2022-12-30 DIAGNOSIS — K219 Gastro-esophageal reflux disease without esophagitis: Secondary | ICD-10-CM

## 2022-12-30 DIAGNOSIS — E669 Obesity, unspecified: Secondary | ICD-10-CM | POA: Diagnosis not present

## 2022-12-30 DIAGNOSIS — E663 Overweight: Secondary | ICD-10-CM | POA: Diagnosis not present

## 2022-12-30 DIAGNOSIS — I10 Essential (primary) hypertension: Secondary | ICD-10-CM | POA: Diagnosis not present

## 2022-12-30 DIAGNOSIS — Z6829 Body mass index (BMI) 29.0-29.9, adult: Secondary | ICD-10-CM

## 2022-12-30 LAB — COMPREHENSIVE METABOLIC PANEL
ALT: 15 U/L (ref 0–35)
AST: 16 U/L (ref 0–37)
Albumin: 4.3 g/dL (ref 3.5–5.2)
Alkaline Phosphatase: 63 U/L (ref 39–117)
BUN: 12 mg/dL (ref 6–23)
CO2: 30 mEq/L (ref 19–32)
Calcium: 9.3 mg/dL (ref 8.4–10.5)
Chloride: 102 mEq/L (ref 96–112)
Creatinine, Ser: 0.75 mg/dL (ref 0.40–1.20)
GFR: 88.56 mL/min (ref >60.00)
Glucose, Bld: 90 mg/dL (ref 70–99)
Potassium: 3.8 mEq/L (ref 3.5–5.1)
Sodium: 140 mEq/L (ref 135–145)
Total Bilirubin: 0.5 mg/dL (ref 0.2–1.2)
Total Protein: 6.6 g/dL (ref 6.0–8.3)

## 2022-12-30 LAB — CBC
HCT: 43.9 % (ref 36.0–46.0)
Hemoglobin: 14.1 g/dL (ref 12.0–15.0)
MCHC: 32.2 g/dL (ref 30.0–36.0)
MCV: 89.2 fl (ref 78.0–100.0)
Platelets: 192 10*3/uL (ref 150.0–400.0)
RBC: 4.92 Mil/uL (ref 3.87–5.11)
RDW: 13 % (ref 11.5–15.5)
WBC: 5.1 10*3/uL (ref 4.0–10.5)

## 2022-12-30 LAB — TSH: TSH: 0.75 u[IU]/mL (ref 0.35–5.50)

## 2022-12-30 LAB — LIPID PANEL
Cholesterol: 191 mg/dL (ref 0–200)
HDL: 59.5 mg/dL (ref >39.00)
LDL Cholesterol: 106 mg/dL — ABNORMAL HIGH (ref 0–99)
NonHDL: 131.04
Total CHOL/HDL Ratio: 3
Triglycerides: 123 mg/dL (ref 0.0–149.0)
VLDL: 24.6 mg/dL (ref 0.0–40.0)

## 2022-12-30 LAB — HEMOGLOBIN A1C: Hgb A1c MFr Bld: 5.6 % (ref 4.6–6.5)

## 2022-12-30 MED ORDER — PHENTERMINE HCL 15 MG PO CAPS: 15.0000 mg | ORAL_CAPSULE | ORAL | 0 refills | Status: DC

## 2022-12-30 NOTE — Patient Instructions (Addendum)
Nice to see you today I will be in touch with the labs once I have them Follow up 3 months for your physical  [facility]  Address: [address] [place], [place], [place] [address] Phone: [phone]

## 2022-12-30 NOTE — Assessment & Plan Note (Addendum)
Patient has lost weight from 176 pounds continue phentermine.  Continue work on healthy lifestyle modifications we will do 1/73-month of the medication.  Patient is tolerating it well.  Did give patient information on healthy weight and wellness clinic if she would like to see them to continue her weight loss and have guidance on nutrition therapy.

## 2022-12-30 NOTE — Progress Notes (Signed)
Established Patient Office Visit  Subjective   Patient ID: Traci Oliver, female    DOB: May 21, 1965  Age: 57 y.o. MRN: [id number]  Chief Complaint  Patient presents with   Medical Management of Chronic Issues    Weight loss f/u    HPI   Obesity: states that she is still doing phentermaine 15mg  a day. States that she is doing well. Quality of life is good. States that she is doing her walking and working in the yard. States that her granddaughter takes her to the park and she will exercise there.   She was seen by sports medicine and had an injection for plantar fasciitis which is wonderful.  Patient states she has been pain-free since the procedure     Review of Systems  Constitutional:  Negative for chills and fever.  Respiratory:  Negative for shortness of breath.   Cardiovascular:  Negative for chest pain.  Neurological:  Negative for headaches.  Psychiatric/Behavioral:  Negative for hallucinations and suicidal ideas. The patient does not have insomnia.       Objective:     BP 126/86   Pulse 90   Temp 97.9 F (36.6 C) (Temporal)   Ht 5' 2.5" (1.588 m)   Wt 165 lb (74.8 kg)   SpO2 99%   BMI 29.70 kg/m  BP Readings from Last 3 Encounters:  12/30/22 126/86  12/12/22 120/80  12/02/22 120/82   Wt Readings from Last 3 Encounters:  12/30/22 165 lb (74.8 kg)  12/12/22 168 lb 6 oz (76.4 kg)  12/02/22 169 lb (76.7 kg)      Physical Exam Vitals and nursing note reviewed.  Constitutional:      Appearance: Normal appearance.  Cardiovascular:     Rate and Rhythm: Normal rate and regular rhythm.     Heart sounds: Normal heart sounds.  Pulmonary:     Effort: Pulmonary effort is normal.     Breath sounds: Normal breath sounds.  Neurological:     Mental Status: She is alert.      No results found for any visits on 12/30/22.    The 10-year ASCVD risk score (Arnett DK, et al., 2019) is: 2.3%    Assessment & Plan:   Problem List Items Addressed This  Visit       Cardiovascular and Mediastinum   Essential hypertension - Primary   Relevant Orders   CBC   Comprehensive metabolic panel   TSH     Digestive   GERD (gastroesophageal reflux disease)     Other   Obesity (BMI 30-39.9)   Relevant Medications   phentermine 15 MG capsule   Other Relevant Orders   Hemoglobin A1c   Lipid panel   Overweight (BMI 25.0-29.9)    Patient has lost weight from 176 pounds continue phentermine.  Continue work on healthy lifestyle modifications we will do 1/59-month of the medication.  Patient is tolerating it well.  Did give patient information on healthy weight and wellness clinic if she would like to see them to continue her weight loss and have guidance on nutrition therapy.      Other Visit Diagnoses     Hyperlipidemia, unspecified hyperlipidemia type       Relevant Orders   Lipid panel   Screening for colon cancer       Relevant Orders   Fecal occult blood, imunochemical([facility])       Return in about 3 months (around 04/01/2023) for CPE .    Audria Nine,  NP

## 2022-12-31 ENCOUNTER — Telehealth: Payer: Self-pay | Admitting: Nurse Practitioner

## 2022-12-31 NOTE — Telephone Encounter (Signed)
Patient called in and had some questions regarding her lab results.

## 2023-01-01 NOTE — Telephone Encounter (Signed)
Left voicemail for patient to call the office back.   

## 2023-01-03 NOTE — Telephone Encounter (Signed)
Patient has since reviewed the comments Matt left under results. Called and left patient a message that if she still has questions after reviewing Matts comments to return the call to our office.

## 2023-01-20 ENCOUNTER — Other Ambulatory Visit: Payer: 59

## 2023-02-03 ENCOUNTER — Ambulatory Visit: Payer: 59 | Admitting: Nurse Practitioner

## 2023-02-04 ENCOUNTER — Ambulatory Visit (INDEPENDENT_AMBULATORY_CARE_PROVIDER_SITE_OTHER): Payer: 59 | Admitting: Nurse Practitioner

## 2023-02-04 ENCOUNTER — Encounter: Payer: Self-pay | Admitting: Nurse Practitioner

## 2023-02-04 VITALS — BP 120/88 | HR 97 | Temp 98.3°F | Ht 64.0 in | Wt 160.8 lb

## 2023-02-04 DIAGNOSIS — Z114 Encounter for screening for human immunodeficiency virus [HIV]: Secondary | ICD-10-CM

## 2023-02-04 DIAGNOSIS — Z87891 Personal history of nicotine dependence: Secondary | ICD-10-CM | POA: Diagnosis not present

## 2023-02-04 DIAGNOSIS — G4489 Other headache syndrome: Secondary | ICD-10-CM

## 2023-02-04 DIAGNOSIS — F32A Depression, unspecified: Secondary | ICD-10-CM

## 2023-02-04 DIAGNOSIS — Z13220 Encounter for screening for lipoid disorders: Secondary | ICD-10-CM

## 2023-02-04 DIAGNOSIS — Z1231 Encounter for screening mammogram for malignant neoplasm of breast: Secondary | ICD-10-CM

## 2023-02-04 DIAGNOSIS — Z1159 Encounter for screening for other viral diseases: Secondary | ICD-10-CM | POA: Diagnosis not present

## 2023-02-04 DIAGNOSIS — K219 Gastro-esophageal reflux disease without esophagitis: Secondary | ICD-10-CM

## 2023-02-04 DIAGNOSIS — E663 Overweight: Secondary | ICD-10-CM

## 2023-02-04 DIAGNOSIS — J029 Acute pharyngitis, unspecified: Secondary | ICD-10-CM

## 2023-02-04 DIAGNOSIS — F419 Anxiety disorder, unspecified: Secondary | ICD-10-CM | POA: Diagnosis not present

## 2023-02-04 DIAGNOSIS — U071 COVID-19: Secondary | ICD-10-CM

## 2023-02-04 DIAGNOSIS — Z0001 Encounter for general adult medical examination with abnormal findings: Secondary | ICD-10-CM

## 2023-02-04 DIAGNOSIS — Z122 Encounter for screening for malignant neoplasm of respiratory organs: Secondary | ICD-10-CM

## 2023-02-04 DIAGNOSIS — I10 Essential (primary) hypertension: Secondary | ICD-10-CM

## 2023-02-04 DIAGNOSIS — Z0000 Encounter for general adult medical examination without abnormal findings: Secondary | ICD-10-CM

## 2023-02-04 LAB — URINALYSIS, MICROSCOPIC ONLY: RBC / HPF: NONE SEEN (ref 0–?)

## 2023-02-04 LAB — COMPREHENSIVE METABOLIC PANEL
ALT: 17 U/L (ref 0–35)
AST: 19 U/L (ref 0–37)
Albumin: 4.5 g/dL (ref 3.5–5.2)
Alkaline Phosphatase: 57 U/L (ref 39–117)
BUN: 11 mg/dL (ref 6–23)
CO2: 31 meq/L (ref 19–32)
Calcium: 9.8 mg/dL (ref 8.4–10.5)
Chloride: 101 meq/L (ref 96–112)
Creatinine, Ser: 0.78 mg/dL (ref 0.40–1.20)
GFR: 84.43 mL/min (ref >60.00)
Glucose, Bld: 110 mg/dL — ABNORMAL HIGH (ref 70–99)
Potassium: 4.1 meq/L (ref 3.5–5.1)
Sodium: 140 meq/L (ref 135–145)
Total Bilirubin: 0.4 mg/dL (ref 0.2–1.2)
Total Protein: 6.9 g/dL (ref 6.0–8.3)

## 2023-02-04 LAB — LIPID PANEL
Cholesterol: 204 mg/dL — ABNORMAL HIGH (ref 0–200)
HDL: 59.9 mg/dL (ref >39.00)
LDL Cholesterol: 117 mg/dL — ABNORMAL HIGH (ref 0–99)
NonHDL: 144.58
Total CHOL/HDL Ratio: 3
Triglycerides: 138 mg/dL (ref 0.0–149.0)
VLDL: 27.6 mg/dL (ref 0.0–40.0)

## 2023-02-04 LAB — CBC
HCT: 46.7 % — ABNORMAL HIGH (ref 36.0–46.0)
Hemoglobin: 15.2 g/dL — ABNORMAL HIGH (ref 12.0–15.0)
MCHC: 32.5 g/dL (ref 30.0–36.0)
MCV: 88.8 fL (ref 78.0–100.0)
Platelets: 171 10*3/uL (ref 150.0–400.0)
RBC: 5.25 Mil/uL — ABNORMAL HIGH (ref 3.87–5.11)
RDW: 13.2 % (ref 11.5–15.5)
WBC: 3.5 10*3/uL — ABNORMAL LOW (ref 4.0–10.5)

## 2023-02-04 LAB — TSH: TSH: 1.29 u[IU]/mL (ref 0.35–5.50)

## 2023-02-04 LAB — POC COVID19 BINAXNOW: SARS Coronavirus 2 Ag: POSITIVE — AB

## 2023-02-04 NOTE — Patient Instructions (Signed)
Nice to see you today I will be in touch with the labs once I have them Quarantine for 2 more days. Follow up with me in 1 year, sooner if you need me

## 2023-02-04 NOTE — Assessment & Plan Note (Signed)
COVID-19 test positive in office.  Did discuss guidelines in regards to self-isolation quarantine.  Had joint discussion with patient elected not to pursue antiviral therapy at the current juncture.  Patient to rest drink plenty of fluid over-the-counter analgesics as needed

## 2023-02-04 NOTE — Assessment & Plan Note (Signed)
Intractable headache likely secondary to COVID.  Patient use over-the-counter analgesics as needed rest and drink plenty of fluid neurological exam benign in office today

## 2023-02-04 NOTE — Assessment & Plan Note (Signed)
Patient currently maintained on lifestyle modification only.  She has lost some weight as of late and has continued to lose weight with lifestyle modifications.  Blood pressure well-controlled stable

## 2023-02-04 NOTE — Progress Notes (Signed)
Established Patient Office Visit  Subjective   Patient ID: Traci Oliver, female    DOB: October 24, 1965  Age: 57 y.o. MRN: [id number]  Chief Complaint  Patient presents with   Annual Exam    Pt would liike to discuss allergy medications.     HPI  HTN: Patient managed on lifestyle modifications only  GERD: Patient states she has not had any flares as of late.  HLD: Patient currently managed on lifestyle modifications only  Headache: states that it started Saturday morning. State sthat her ears were hurting her and throat was hurting. States that the thorat is now itchy. States that the headache is the front the head and has taken tylneol and ibuprofen that helps for a breif period. States that she mowed the lawn Friday. No sneezing or ithcy water eyes   for complete physical and follow up of chronic conditions.  Immunizations: -Tetanus: Completed in 2024 -Influenza: refused  -Shingles: discussed in office  -Pneumonia: Too young  Diet: Fair diet. States that she eats 2 meals a day. States that she wil lhave jello and tangerines. Stae sthat she will drink water and coffee  Exercise: States that she is walking. States that she has been doing situps in her bed  Eye exam: Completes annually . Wears galsses. Has been about a year Dental exam: Completes semi-annually    Colonoscopy: Has not done.  Patient does have iFOB at home that she has not completed encourage patient to complete at Lung Cancer Screening: Ambulatory referral to the low-dose CT scan program ordered today  Pap smear: 12/05/2020, negative with negative hpv. Hx of cervical acancer repeat in 3   Mamogram: 03/04/2022  Dexa: too young   Sleep: states she goes to bed around 830-9 and get up around 345am. States that she feels resed. Does not snore      12/30/2022    8:59 AM 12/02/2022    8:59 AM 11/01/2022   11:51 AM  PHQ9 SCORE ONLY  PHQ-9 Total Score 0 0 0       12/30/2022    8:59 AM 12/02/2022    8:59 AM  11/01/2022   11:51 AM 07/29/2022   12:13 PM  GAD 7 : Generalized Anxiety Score  Nervous, Anxious, on Edge 0 0 0 0  Control/stop worrying 0 0 0 0  Worry too much - different things 0 0 0 0  Trouble relaxing 0 0 0 0  Restless 0 0 0 0  Easily annoyed or irritable 0 0 0 0  Afraid - awful might happen 0 0 0 0  Total GAD 7 Score 0 0 0 0  Anxiety Difficulty Not difficult at all Not difficult at all Not difficult at all Not difficult at all          Review of Systems  Constitutional:  Positive for chills and malaise/fatigue. Negative for fever.  HENT:  Negative for ear pain and sore throat.   Respiratory:  Negative for cough and shortness of breath.   Cardiovascular:  Negative for chest pain (right side that lasted 5-6 minuts. State that she repositioned and it went away) and leg swelling.  Gastrointestinal:  Negative for abdominal pain, blood in stool, constipation, diarrhea, nausea and vomiting.       BM every other day   Genitourinary:  Negative for dysuria and hematuria.  Musculoskeletal:  Negative for myalgias.  Neurological:  Positive for headaches.  Psychiatric/Behavioral:  Negative for hallucinations and suicidal ideas.  Objective:     BP 120/88   Pulse 97   Temp 98.3 F (36.8 C) (Oral)   Ht 5\' 4"  (1.626 m)   Wt 160 lb 12.8 oz (72.9 kg)   SpO2 98%   BMI 27.60 kg/m  BP Readings from Last 3 Encounters:  02/04/23 120/88  12/30/22 126/86  12/12/22 120/80   Wt Readings from Last 3 Encounters:  02/04/23 160 lb 12.8 oz (72.9 kg)  12/30/22 165 lb (74.8 kg)  12/12/22 168 lb 6 oz (76.4 kg)      Physical Exam Vitals and nursing note reviewed.  Constitutional:      Appearance: Normal appearance.  HENT:     Right Ear: Tympanic membrane, ear canal and external ear normal.     Left Ear: Tympanic membrane, ear canal and external ear normal.     Mouth/Throat:     Mouth: Mucous membranes are moist.     Pharynx: Oropharynx is clear.  Eyes:     Extraocular  Movements: Extraocular movements intact.     Pupils: Pupils are equal, round, and reactive to light.  Cardiovascular:     Rate and Rhythm: Normal rate and regular rhythm.     Pulses: Normal pulses.     Heart sounds: Normal heart sounds.  Pulmonary:     Effort: Pulmonary effort is normal.     Breath sounds: Normal breath sounds.  Abdominal:     General: Bowel sounds are normal. There is no distension.     Palpations: There is no mass.     Tenderness: There is no abdominal tenderness.     Hernia: No hernia is present.  Musculoskeletal:     Right lower leg: No edema.     Left lower leg: No edema.  Lymphadenopathy:     Cervical: No cervical adenopathy.  Skin:    General: Skin is warm.  Neurological:     General: No focal deficit present.     Mental Status: She is alert.     Deep Tendon Reflexes:     Reflex Scores:      Bicep reflexes are 2+ on the right side and 2+ on the left side.      Patellar reflexes are 2+ on the right side and 2+ on the left side.    Comments: Bilateral upper and lower extremity strength 5/5  Psychiatric:        Mood and Affect: Mood normal.        Behavior: Behavior normal.        Thought Content: Thought content normal.        Judgment: Judgment normal.      Results for orders placed or performed in visit on 02/04/23  Urine Microscopic  Result Value Ref Range   WBC, UA 0-2/hpf 0-2/hpf   RBC / HPF none seen 0-2/hpf   Squamous Epithelial / HPF Rare(0-4/hpf) Rare(0-4/hpf)  POC COVID-19 BinaxNow  Result Value Ref Range   SARS Coronavirus 2 Ag Positive (A) Negative      The 10-year ASCVD risk score (Arnett DK, et al., 2019) is: 1.9%    Assessment & Plan:   Problem List Items Addressed This Visit       Cardiovascular and Mediastinum   Essential hypertension    Patient currently maintained on lifestyle modification only.  She has lost some weight as of late and has continued to lose weight with lifestyle modifications.  Blood pressure  well-controlled stable        Digestive  GERD (gastroesophageal reflux disease)    Currently managed on lifestyle modifications.  Stable        Other   Anxiety and depression    Currently stable off of medication.      Overweight    Patient is continue to work on lifestyle modifications and continue to lose weight.  Continue      Other headache syndrome    Intractable headache likely secondary to COVID.  Patient use over-the-counter analgesics as needed rest and drink plenty of fluid neurological exam benign in office today      Relevant Orders   POC COVID-19 BinaxNow (Completed)   Former smoker    History of smoking.  Will do urine microscopy to rule out microscopic hematuria.      Relevant Orders   Urine Microscopic (Completed)   Preventative health care - Primary    Discussed age-appropriate immunizations and screening exams.  Did review patient's personal, surgical, social, family histories.  Patient is not up-to-date with CRC screening she has 95 at home that she has not completed encourage patient to complete.  Did discuss age-appropriate immunizations.  She declined the flu vaccine we did discuss the shingles vaccine in office.  Patient up-to-date on mammogram will be due next month order placed today.  Patient is up-to-date on CRC screening she will be due next year.  Patient was given information at discharge about preventative healthcare maintenance with anticipatory guidance.      Relevant Orders   CBC   Comprehensive metabolic panel   TSH   Sore throat   Relevant Orders   POC COVID-19 BinaxNow (Completed)   COVID-19    COVID-19 test positive in office.  Did discuss guidelines in regards to self-isolation quarantine.  Had joint discussion with patient elected not to pursue antiviral therapy at the current juncture.  Patient to rest drink plenty of fluid over-the-counter analgesics as needed      Other Visit Diagnoses     Encounter for hepatitis C screening  test for low risk patient       Relevant Orders   Hepatitis C Antibody   Encounter for screening for HIV       Relevant Orders   HIV antibody (with reflex)   Screening for lipid disorders       Relevant Orders   Lipid panel   Screening for lung cancer       Relevant Orders   Ambulatory Referral Lung Cancer Screening [facility]   Screening mammogram for breast cancer       Relevant Orders   MM 3D SCREENING MAMMOGRAM BILATERAL BREAST       Return in about 1 year (around 02/04/2024) for CPE and Labs.    Audria Nine, NP

## 2023-02-04 NOTE — Assessment & Plan Note (Signed)
History of smoking.  Will do urine microscopy to rule out microscopic hematuria.

## 2023-02-04 NOTE — Assessment & Plan Note (Signed)
Discussed age-appropriate immunizations and screening exams.  Did review patient's personal, surgical, social, family histories.  Patient is not up-to-date with CRC screening she has 95 at home that she has not completed encourage patient to complete.  Did discuss age-appropriate immunizations.  She declined the flu vaccine we did discuss the shingles vaccine in office.  Patient up-to-date on mammogram will be due next month order placed today.  Patient is up-to-date on CRC screening she will be due next year.  Patient was given information at discharge about preventative healthcare maintenance with anticipatory guidance.

## 2023-02-04 NOTE — Assessment & Plan Note (Signed)
Currently stable off of medication.

## 2023-02-04 NOTE — Assessment & Plan Note (Signed)
Currently managed on lifestyle modifications.  Stable

## 2023-02-04 NOTE — Assessment & Plan Note (Signed)
Patient is continue to work on lifestyle modifications and continue to lose weight.  Continue

## 2023-02-05 ENCOUNTER — Telehealth: Payer: Self-pay | Admitting: Nurse Practitioner

## 2023-02-05 DIAGNOSIS — U071 COVID-19: Secondary | ICD-10-CM

## 2023-02-05 LAB — HEPATITIS C ANTIBODY: Hepatitis C Ab: NONREACTIVE

## 2023-02-05 LAB — HIV ANTIBODY (ROUTINE TESTING W REFLEX): HIV 1&2 Ab, 4th Generation: NONREACTIVE

## 2023-02-05 MED ORDER — NIRMATRELVIR/RITONAVIR (PAXLOVID)TABLET: 3.0000 | ORAL_TABLET | Freq: Two times a day (BID) | ORAL | 0 refills | Status: AC

## 2023-02-05 NOTE — Telephone Encounter (Signed)
Pt called stating Traci Oliver gave her a note excuse from work until tomorrow, 10/3. Pt asked could she receive a note to be excused from work for Fri, 10/4, as well? Pt also asked if a rx could be prescribed for her too? Call back # [phone]

## 2023-02-05 NOTE — Telephone Encounter (Signed)
Is she not improving with her sympotms? I will send in the antiviral treatment for covid for her

## 2023-02-05 NOTE — Addendum Note (Signed)
Addended by: Eden Emms on: 02/05/2023 04:30 PM   Modules accepted: Orders

## 2023-02-06 ENCOUNTER — Telehealth: Payer: Self-pay | Admitting: Nurse Practitioner

## 2023-02-06 NOTE — Telephone Encounter (Signed)
Contacted pt in regards to positive COVID test.   Pt stated that her symptoms are worsening. Stomach pain, chest pain, nose burning.  Pt stated that she cannot afford Plaxilovid and has only been taking tylenol.  Spoke with PCP to be advised.  PCP stated that we can try Molnupiravir instead however the cost might be high. PCP has advised pt to rest, lots of fluid, and tylenol or ibuprofen.  Pt is okay to take OTC medication.  Informed pt that the Out of work note approved by PCP until Monday has been sent to Mychart.  Pt agreed to follow up if symptoms worsen.

## 2023-02-06 NOTE — Telephone Encounter (Signed)
Patient called in and stated that she needed to speak with the CMA in regards to her Covid test she had. She can be reached at [phone]. Thank you!

## 2023-03-07 DIAGNOSIS — M722 Plantar fascial fibromatosis: Secondary | ICD-10-CM | POA: Diagnosis not present

## 2023-03-07 DIAGNOSIS — R32 Unspecified urinary incontinence: Secondary | ICD-10-CM | POA: Diagnosis not present

## 2023-03-07 DIAGNOSIS — Z8249 Family history of ischemic heart disease and other diseases of the circulatory system: Secondary | ICD-10-CM | POA: Diagnosis not present

## 2023-03-07 DIAGNOSIS — G8929 Other chronic pain: Secondary | ICD-10-CM | POA: Diagnosis not present

## 2023-03-07 DIAGNOSIS — Z87891 Personal history of nicotine dependence: Secondary | ICD-10-CM | POA: Diagnosis not present

## 2023-03-07 DIAGNOSIS — J309 Allergic rhinitis, unspecified: Secondary | ICD-10-CM | POA: Diagnosis not present

## 2023-03-07 DIAGNOSIS — Z809 Family history of malignant neoplasm, unspecified: Secondary | ICD-10-CM | POA: Diagnosis not present

## 2023-03-08 ENCOUNTER — Other Ambulatory Visit: Payer: Self-pay | Admitting: Nurse Practitioner

## 2023-03-08 DIAGNOSIS — R21 Rash and other nonspecific skin eruption: Secondary | ICD-10-CM

## 2023-04-17 ENCOUNTER — Ambulatory Visit: Payer: 59

## 2023-05-12 ENCOUNTER — Telehealth: Payer: Self-pay

## 2023-05-12 ENCOUNTER — Other Ambulatory Visit: Payer: Self-pay | Admitting: Nurse Practitioner

## 2023-05-12 DIAGNOSIS — E669 Obesity, unspecified: Secondary | ICD-10-CM

## 2023-05-12 NOTE — Telephone Encounter (Signed)
 Contacted pt to schedule office visit to discuss weight and diet.  Pt declined to set up an appointment. No further questions or concerns.

## 2023-05-12 NOTE — Telephone Encounter (Signed)
 Copied from CRM [id number]. Topic: Clinical - Medication Question >> May 12, 2023  8:16 AM Leila C wrote: Reason for CRM: Patient [phone] is asking if she's able to restart phentermine  15 MG capsule phentermine  15 MG capsule. If so please send to [facility] [address], [place] -  [address] [facility] [place] [address] Phone:[phone]:[phone]

## 2023-05-12 NOTE — Telephone Encounter (Signed)
 She will need an office visit for starting weight and discussion about diet

## 2023-05-13 ENCOUNTER — Ambulatory Visit: Payer: 59 | Admitting: Nurse Practitioner

## 2023-07-01 ENCOUNTER — Ambulatory Visit: Payer: Self-pay | Admitting: Nurse Practitioner

## 2023-07-01 NOTE — Telephone Encounter (Signed)
  Chief Complaint: right heel pain Symptoms: right heel pain Frequency: worsening x 3 months Pertinent Negatives: Patient denies fever, pain radiating up the right leg, swelling, rash, numbness, discoloration. Disposition: [] ED /[] Urgent Care (no appt availability in office) / [x] Appointment(In office/virtual)/ []  [facility]/ [] Home Care/ [] Refused Recommended Disposition /[] [facility]/ []  Follow-up with PCP Additional Notes: Patient states she has been treating with Naproxen, applying ointment and wearing a splint.  Patient requesting Dr Patsy Lager, she states she saw him last time for her plantar fascitis and he did a great job with her injection. Patient scheduled for tomorrow with Dr Patsy Lager and given instructions to call back or go to urgent care if any new or worsening symptoms.  Copied from CRM [id number]. Topic: Clinical - Medical Advice >> Jul 01, 2023 12:14 PM Traci Oliver wrote: Reason for CRM: Patient is wanting to come in for referral but she is in severe pain in her right foot. Two bone spurs in foot Reason for Disposition  [1] SEVERE pain (e.g., excruciating, unable to do any normal activities) AND [2] not improved after 2 hours of pain medicine  Answer Assessment - Initial Assessment Questions 1. ONSET: "When did the pain start?"      Ongoing for greater than 6 months, worsening x 3 months .  2. LOCATION: "Where is the pain located?"      Right foot, in the heel.  3. PAIN: "How bad is the pain?"    (Scale 1-10; or mild, moderate, severe)  - MILD (1-3): doesn't interfere with normal activities.   - MODERATE (4-7): interferes with normal activities (e.g., work or school) or awakens from sleep, limping.   - SEVERE (8-10): excruciating pain, unable to do any normal activities, unable to walk.      10/10, states the took Naproxen, applied ointment, and wearing a splint. She states she can tolerate walking on it but it is painful. Pain not as bad in the morning and  worsens throughout the day.  4. WORK OR EXERCISE: "Has there been any recent work or exercise that involved this part of the body?"      Denies.  5. CAUSE: "What do you think is causing the foot pain?"     Patient seen back in August and diagnosed with plantar fascitis. States she has 2 heel spurs in the right foot.  6. OTHER SYMPTOMS: "Do you have any other symptoms?" (e.g., leg pain, rash, fever, numbness)     Denies.  Protocols used: Foot Pain-A-AH

## 2023-07-02 ENCOUNTER — Ambulatory Visit: Payer: Self-pay | Admitting: Family Medicine

## 2023-07-02 ENCOUNTER — Telehealth: Payer: Self-pay | Admitting: Nurse Practitioner

## 2023-07-02 ENCOUNTER — Ambulatory Visit (INDEPENDENT_AMBULATORY_CARE_PROVIDER_SITE_OTHER): Payer: Self-pay | Admitting: Internal Medicine

## 2023-07-02 VITALS — BP 132/86 | HR 96 | Temp 98.2°F | Ht 64.0 in | Wt 169.0 lb

## 2023-07-02 DIAGNOSIS — M79673 Pain in unspecified foot: Secondary | ICD-10-CM

## 2023-07-02 NOTE — Assessment & Plan Note (Signed)
 No visit Will need Dr Patsy Lager for injection

## 2023-07-02 NOTE — Progress Notes (Signed)
 No visit---wanted foot injection that I don't do

## 2023-07-02 NOTE — Telephone Encounter (Signed)
 Called the patient back and stated that I was told she needed to speak to me and she said I just needed to know your name and that was all. I asked if she needed anything else and she said no. Told me to have a good day and then hung up,

## 2023-07-02 NOTE — Telephone Encounter (Signed)
 Copied from CRM [id number]. Topic: General - Other >> Jul 02, 2023 12:35 PM Isabell A wrote: Reason for CRM: Patient would like to know who took her to the back today to get her weighed, she is requesting a call back with their name - didn't provide any other info as to why.

## 2023-07-07 ENCOUNTER — Encounter: Payer: Self-pay | Admitting: Podiatry

## 2023-07-07 ENCOUNTER — Telehealth: Payer: Self-pay | Admitting: Podiatry

## 2023-07-07 ENCOUNTER — Ambulatory Visit (INDEPENDENT_AMBULATORY_CARE_PROVIDER_SITE_OTHER)

## 2023-07-07 ENCOUNTER — Telehealth: Payer: Self-pay | Admitting: Nurse Practitioner

## 2023-07-07 ENCOUNTER — Ambulatory Visit (INDEPENDENT_AMBULATORY_CARE_PROVIDER_SITE_OTHER): Payer: No Typology Code available for payment source | Admitting: Podiatry

## 2023-07-07 ENCOUNTER — Ambulatory Visit: Payer: No Typology Code available for payment source | Admitting: Family Medicine

## 2023-07-07 DIAGNOSIS — M722 Plantar fascial fibromatosis: Secondary | ICD-10-CM

## 2023-07-07 NOTE — Telephone Encounter (Signed)
 This Pt. Should not be marked as a No Show. She left w/o been seen. Stated that she had been waiting for 45 minutes. Asked did she come to let staff know prior. She stated no, she did not know she needed to let staff know she was still waiting.I apologized and explained that we could have went to check with Dr./CMA if we had known. She stated that was fine, wanted to make sure her insurance will not be ran and refused to rescheduled.   They called her back about after she left. I had already marked her as un-checked in but did not get a chance to finish up before she was marked as a no show.

## 2023-07-07 NOTE — Telephone Encounter (Signed)
 Returned pt call. Pt stated that she does not need copies and will look at [facility] for OV and xray results.

## 2023-07-07 NOTE — Telephone Encounter (Signed)
 Copied from CRM [id number]. Topic: Medical Record Request - Records Request >> Jul 07, 2023  8:24 AM Traci Oliver wrote: Reason for CRM: Patient would like to come in to get Oliver copy of her x-ray results & her most recent visit office from Dr.Copland.  Patient would like Oliver call back when this is ready to be picked up.

## 2023-07-08 NOTE — Progress Notes (Unsigned)
   Traci Tolles T. Zaiah Eckerson, MD, CAQ Sports Medicine [facility] at [facility] [address] [place], [address]  Phone: [phone]  FAX: [phone]  Traci Oliver - 58 y.o. female  MRN [id number]  Date of Birth: Oct 08, 1965  Date: 07/09/2023  PCP: Eden Emms, NP  Referral: Eden Emms, NP  No chief complaint on file.  Subjective:   Traci Oliver is a 58 y.o. very pleasant female patient with There is no height or weight on file to calculate BMI. who presents with the following:  Is a pleasant lady who I have seen previously for some plantar fasciitis.  As I recall, she has had issues with plantar fasciitis off-and-on over the last 10 years.    Review of Systems is noted in the HPI, as appropriate  Objective:   There were no vitals taken for this visit.  GEN: No acute distress; alert,appropriate. PULM: Breathing comfortably in no respiratory distress PSYCH: Normally interactive.   Laboratory and Imaging Data:  Assessment and Plan:   ***

## 2023-07-09 ENCOUNTER — Ambulatory Visit (INDEPENDENT_AMBULATORY_CARE_PROVIDER_SITE_OTHER): Admitting: Family Medicine

## 2023-07-09 ENCOUNTER — Ambulatory Visit: Payer: Self-pay | Admitting: Family Medicine

## 2023-07-09 ENCOUNTER — Encounter: Payer: Self-pay | Admitting: Family Medicine

## 2023-07-09 VITALS — BP 110/70 | HR 81 | Temp 97.9°F | Ht 64.0 in | Wt 168.2 lb

## 2023-07-09 DIAGNOSIS — M79671 Pain in right foot: Secondary | ICD-10-CM | POA: Diagnosis not present

## 2023-07-09 DIAGNOSIS — G8929 Other chronic pain: Secondary | ICD-10-CM | POA: Diagnosis not present

## 2023-07-09 DIAGNOSIS — M722 Plantar fascial fibromatosis: Secondary | ICD-10-CM

## 2023-07-09 MED ORDER — TRIAMCINOLONE ACETONIDE 40 MG/ML IJ SUSP
30.0000 mg | Freq: Once | INTRAMUSCULAR | Status: AC
Start: 2023-07-09 — End: 2023-07-09
  Administered 2023-07-09: 30 mg via INTRA_ARTICULAR

## 2023-07-09 NOTE — Patient Instructions (Signed)
 Excellent Over the Counter Orthotics:  Hapad: available at www.hapad.com (Green Sports Insoles)  SPENCO: Available at some sports stores or GoalForum.com.au. (I prefer the full-length green orthotic that has a yellow bottom / base)  www.pedifix.com and Financial controller Medicine" website: multiple good foot products  SHOES: Danskos, Merrells, Keens, Clarks, Birkenstocks - good arch support, want minimal bendability. Finn Comfort also excellent, but even more expensive.  Tennis Shoes / Running Shoes: Many good companies and styles. The most important thing is to get a good fit and wear a shoe that feels good in the store. Walk around in the store. Run if that is something that you can do. Some of the running stores have a treadmill, also.  Try Brooks Adrenaline.  You could also try the Ghost line, but I think you would do well with Adrenaline.  Brooks, New Balance, Andrena Mews are good, but the most important thing is to wear a shoe that fits your foot and feels good.  [employer] in [place]: excellent staff, Armed forces technical officer (Running and [employer]), OTC orthotics. On [address] across from the [employer]. For running shoe and athletic shoe fit, they are the best.  Nicer shoes:  Birkenstock shoes, [employer], [place]: Excellent selection  [employer], [address] [place], [place]: They have the largest selection of comfort and supportive shoes that I have ever seen, and their staff is generally quite good in fitting you for shoes. (They will intermittently have sales, mail coupons, and you can look on their website.)  [employer]: [address] [place], [place]

## 2023-07-28 ENCOUNTER — Ambulatory Visit: Payer: Self-pay | Admitting: Nurse Practitioner

## 2023-08-26 ENCOUNTER — Inpatient Hospital Stay: Admitting: Nurse Practitioner

## 2023-08-26 NOTE — Progress Notes (Deleted)
   Acute Office Visit  Subjective:     Patient ID: Traci Oliver, female    DOB: [birth year], 58 y.o.   MRN: [id number]  No chief complaint on file.   HPI Patient is in today for Hosptial follow up  Patient seen in the emergency department on 08/17/2023 through [facility] health care.  Patient presented to the emergency department with chest pain and near syncopal episode.  Patient did have an acute onset of nausea with vomiting and dizziness just prior to the onset of the syncopal episode she also endorsed shortness of breath and palpitations.  This happened while she driving back from the beach.  She vomited several times without blood or biliary content.  Patient underwent lab work, EKG, CT of the chest.  Troponins came back negative.  Other labs were grossly normal.  Patient CT of the chest.  It was negative for acute pulmonary embolus or acute finding in chest.  They did notice a subcentimeter hypoattenuation in the dome of the right liver likely represent a flash filling hemangioma or perfusional changes.  Patient is here for follow-up  ROS      Objective:    There were no vitals taken for this visit. {Vitals History (Optional):23777}  Physical Exam  No results found for any visits on 08/26/23.      Assessment & Plan:   Problem List Items Addressed This Visit   None   No orders of the defined types were placed in this encounter.   No follow-ups on file.  Margarie Shay, NP

## 2023-08-28 ENCOUNTER — Telehealth: Payer: Self-pay

## 2023-08-28 ENCOUNTER — Ambulatory Visit (INDEPENDENT_AMBULATORY_CARE_PROVIDER_SITE_OTHER): Admitting: Nurse Practitioner

## 2023-08-28 ENCOUNTER — Other Ambulatory Visit: Payer: Self-pay | Admitting: Nurse Practitioner

## 2023-08-28 ENCOUNTER — Encounter: Payer: Self-pay | Admitting: Nurse Practitioner

## 2023-08-28 VITALS — BP 118/82 | HR 87 | Temp 98.0°F | Ht 64.0 in | Wt 166.4 lb

## 2023-08-28 DIAGNOSIS — Z09 Encounter for follow-up examination after completed treatment for conditions other than malignant neoplasm: Secondary | ICD-10-CM | POA: Insufficient documentation

## 2023-08-28 DIAGNOSIS — F419 Anxiety disorder, unspecified: Secondary | ICD-10-CM | POA: Diagnosis not present

## 2023-08-28 DIAGNOSIS — R0789 Other chest pain: Secondary | ICD-10-CM | POA: Diagnosis not present

## 2023-08-28 MED ORDER — FLUOXETINE HCL 10 MG PO CAPS: ORAL_CAPSULE | ORAL | 0 refills | Status: DC

## 2023-08-28 MED ORDER — FLUOXETINE HCL 20 MG PO TABS: ORAL_TABLET | ORAL | 0 refills | Status: DC

## 2023-08-28 NOTE — Telephone Encounter (Signed)
 Capsules sent into pharmacy

## 2023-08-28 NOTE — Telephone Encounter (Signed)
 Copied from CRM [id number]. Topic: Clinical - Prescription Issue >> Aug 28, 2023 12:16 PM [place] J wrote: Reason for CRM: [facility] FLU[facility]  (PROZAC ) 20 MG tablet. They stated that they need to have an alternative medication sent in.   Patient stated that she is [facility]: [facility] [address] [place], [place] [address]

## 2023-08-28 NOTE — Assessment & Plan Note (Signed)
 Moderate anxiety but given patient's age history will refer to cardiology.  Ambulatory referral to cardiology today

## 2023-08-28 NOTE — Progress Notes (Signed)
 Acute Office Visit  Subjective:     Patient ID: Traci Oliver, female    DOB: [birth year], 58 y.o.   MRN: [id number]  Chief Complaint  Patient presents with   Hospitalization Follow-up    Pt complains of feeling a little better in the past couple of days. Pt believes palpitations are due to stress. Pt requests referral to cardiologist.     HPI Patient is in today for Hosptial follow up  Patient seen in the emergency department on 08/17/2023 through [facility] health care.  Patient presented to the emergency department with chest pain and near syncopal episode.  Patient did have an acute onset of nausea with vomiting and dizziness just prior to the onset of the syncopal episode she also endorsed shortness of breath and palpitations.  This happened while she driving back from the beach.  She vomited several times without blood or biliary content.  Patient underwent lab work, EKG, CT of the chest.  Troponins came back negative.  Other labs were grossly normal.  Patient CT of the chest.  It was negative for acute pulmonary embolus or acute finding in chest.  They did notice a subcentimeter hypoattenuation in the dome of the right liver likely represent a flash filling hemangioma or perfusional changes.  Patient is here for follow-up State that it took her several days to get over it all. States that hse is having a lot of anxiety. States that she feels the same as she did prior to being on the prozac . She is having pressure on her chest and anxious feeling. States that she is doing well with sleeping. States that she has not been having trouble Has tried trazodone  and did not do well. States that she is waiting for the toher shoe to drop. 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  Respiratory:  Negative for shortness of breath.   Cardiovascular:   Positive for chest pain (when she gets upset. heaviness on the chest).  Gastrointestinal:  Negative for abdominal pain, diarrhea, nausea and vomiting.  Neurological:  Negative for dizziness and headaches.  Psychiatric/Behavioral:  Negative for hallucinations and suicidal ideas.         Objective:    BP 118/82   Pulse 87   Temp 98 F (36.7 C) (Oral)   Ht 5\' 4"  (1.626 m)   Wt 166 lb 6.4 oz (75.5 kg)   SpO2 97%   BMI 28.56 kg/m    Physical Exam Vitals and nursing note reviewed.  Constitutional:      Appearance: Normal appearance.  Cardiovascular:     Rate and Rhythm: Normal rate and regular rhythm.     Heart sounds: Normal heart sounds.  Pulmonary:     Effort: Pulmonary effort is normal.     Breath sounds: Normal breath sounds.  Abdominal:     General: Bowel sounds are normal. There is no distension.     Palpations: There is no mass.     Tenderness: There is no abdominal tenderness.     Hernia: No hernia is present.  Neurological:     Mental Status: She is alert.     No results found for any visits on 08/28/23.      Assessment & Plan:   Problem List Items Addressed This Visit       Other  Anxiety   History of the same. She was on prozac  and done well. WE will start her back on prozac . 10mg  daily for 2 week then titrate up to 20mg  daily there after.  Patient denies HI/SI/AVH.      Relevant Medications   FLUoxetine  (PROZAC ) 20 MG tablet   Hospital discharge follow-up - Primary   Interview emergency department note along with labs and imaging.      Other chest pain   Moderate anxiety but given patient's age history will refer to cardiology.  Ambulatory referral to cardiology today      Relevant Orders   Ambulatory referral to Cardiology    Meds ordered this encounter  Medications   FLUoxetine  (PROZAC ) 20 MG tablet    Sig: Take 0.5 tablets (10 mg total) by mouth daily for 14 days, THEN 1 tablet (20 mg total) daily for 16 days.    Dispense:  23 tablet     Refill:  0    Supervising Provider:   Deri Fleet A [[id number]]    Return in about 6 weeks (around 10/09/2023) for anxiety recheck .  Traci Shay, NP

## 2023-08-28 NOTE — Patient Instructions (Signed)
 Nice to see you today I have sent in the medications and referred you to a cardiologist Follow up with me in 6-8 weeks, sooner if you need me

## 2023-08-28 NOTE — Assessment & Plan Note (Signed)
 History of the same. She was on prozac  and done well. WE will start her back on prozac . 10mg  daily for 2 week then titrate up to 20mg  daily there after.  Patient denies HI/SI/AVH.

## 2023-08-28 NOTE — Addendum Note (Signed)
 Addended by: Dorothe Gaster on: 08/28/2023 02:08 PM   Modules accepted: Orders

## 2023-08-28 NOTE — Assessment & Plan Note (Signed)
 Interview emergency department note along with labs and imaging.

## 2023-08-28 NOTE — Telephone Encounter (Signed)
 Communication  Reason for CRM: Patient called in stating Pharmacy advised her prescription FLUoxetine  (PROZAC ) 20 MG tablet would need to be approve through insurance, patient gave insurance a call and they told her they will only approve the capsules.

## 2023-08-29 NOTE — Telephone Encounter (Signed)
 Pt is requesting Capsules for Fluoxetine . States that insurance will only cover capsules.

## 2023-09-18 ENCOUNTER — Ambulatory Visit: Attending: Cardiology | Admitting: Cardiology

## 2023-09-18 ENCOUNTER — Encounter: Payer: Self-pay | Admitting: Cardiology

## 2023-09-18 VITALS — BP 115/62 | HR 73 | Ht 63.5 in | Wt 167.2 lb

## 2023-09-18 DIAGNOSIS — R072 Precordial pain: Secondary | ICD-10-CM | POA: Diagnosis not present

## 2023-09-18 DIAGNOSIS — E7800 Pure hypercholesterolemia, unspecified: Secondary | ICD-10-CM

## 2023-09-18 DIAGNOSIS — R0789 Other chest pain: Secondary | ICD-10-CM

## 2023-09-18 MED ORDER — METOPROLOL TARTRATE 100 MG PO TABS: ORAL_TABLET | ORAL | 0 refills | Status: DC

## 2023-09-18 NOTE — Patient Instructions (Addendum)
 Medication Instructions:  Your physician recommends the following medication changes.  TAKE: Metoprolol 100 about 2 hours before your CTA  *If you need a refill on your cardiac medications before your next appointment, please call your pharmacy*  Lab Work: Your provider would like for you to have following labs drawn today BMeT.   If you have labs (blood work) drawn today and your tests are completely normal, you will receive your results only by: MyChart Message (if you have MyChart) OR A paper copy in the mail If you have any lab test that is abnormal or we need to change your treatment, we will call you to review the results.  Testing/Procedures: Your cardiac CT will be scheduled at :   [facility] [address] [place], [place] [address] [phone]  When scheduled at  [facility], please arrive 15 mins early for check-in and test prep.  Please follow these instructions carefully (unless otherwise directed):  An IV will be required for this test and Nitroglycerin will be given.  Hold all erectile dysfunction medications at least 3 days (72 hrs) prior to test. (Ie viagra, cialis, sildenafil, tadalafil, etc)   On the Night Before the Test: Be sure to Drink plenty of water. Do not consume any caffeinated/decaffeinated beverages or chocolate 12 hours prior to your test. Do not take any antihistamines 12 hours prior to your test.  On the Day of the Test: Drink plenty of water until 1 hour prior to the test. Do not eat any food 1 hour prior to test. You may take your regular medications prior to the test.  Take metoprolol (Lopressor) two hours prior to test. Patients who wear a continuous glucose monitor MUST remove the device prior to scanning. FEMALES- please wear underwire-free bra if available, avoid dresses & tight clothing       After the Test: Drink plenty of water. After receiving IV contrast, you may experience a  mild flushed feeling. This is normal. On occasion, you may experience a mild rash up to 24 hours after the test. This is not dangerous. If this occurs, you can take Benadryl 25 mg, Zyrtec, Claritin, or Allegra and increase your fluid intake. (Patients taking Tikosyn should avoid Benadryl, and may take Zyrtec, Claritin, or Allegra) If you experience trouble breathing, this can be serious. If it is severe call 911 IMMEDIATELY. If it is mild, please call our office.  We will call to schedule your test 2-4 weeks out understanding that some insurance companies will need an authorization prior to the service being performed.   For more information and frequently asked questions, please visit our website : [email or web address]  For non-scheduling related questions, please contact the cardiac imaging nurse navigator should you have any questions/concerns: Cardiac Imaging Nurse Navigators Direct Office Dial: [phone]   For scheduling needs, including cancellations and rescheduling, please call [place], [phone].   Your physician has requested that you have an echocardiogram. Echocardiography is a painless test that uses sound waves to create images of your heart. It provides your doctor with information about the size and shape of your heart and how well your heart's chambers and valves are working.   You may receive an ultrasound enhancing agent through an IV if needed to better visualize your heart during the echo. This procedure takes approximately one hour.  There are no restrictions for this procedure.  This will take place at [address] [facility] [address], [place] [address]  Please  note: We ask at that you not bring children with you during ultrasound (echo/ vascular) testing. Due to room size and safety concerns, children are not allowed in the ultrasound rooms during exams. Our front office staff cannot provide observation of children in our lobby area  while testing is being conducted. An adult accompanying a patient to their appointment will only be allowed in the ultrasound room at the discretion of the ultrasound technician under special circumstances. We apologize for any inconvenience.   Follow-Up: At [facility], you and your health needs are our priority.  As part of our continuing mission to provide you with exceptional heart care, our providers are all part of one team.  This team includes your primary Cardiologist (physician) and Advanced Practice Providers or APPs (Physician Assistants and Nurse Practitioners) who all work together to provide you with the care you need, when you need it.  Your next appointment:   2-3 month(s)  Provider:   Constancia Delton, MD    We recommend signing up for the patient portal called "MyChart".  Sign up information is provided on this After Visit Summary.  MyChart is used to connect with patients for Virtual Visits (Telemedicine).  Patients are able to view lab/test results, encounter notes, upcoming appointments, etc.  Non-urgent messages can be sent to your provider as well.   To learn more about what you can do with MyChart, go to [email or web address].

## 2023-09-18 NOTE — Progress Notes (Signed)
 Cardiology Office Note:    Date:  09/18/2023   ID:  Traci Oliver, DOB [birth year], MRN [id number]  PCP:  Dorothe Gaster, NP   [facility] Providers Cardiologist:  None     Referring MD: Dorothe Gaster, NP   Chief Complaint  Patient presents with   Follow-up    Had some chest pain yesterday, medication reviewed verbally with patient    Traci Oliver is a 58 y.o. female who is being seen today for the evaluation of chest pain at the request of Dorothe Gaster, NP.   History of Present Illness:    Traci Oliver is a 58 y.o. female with a hx of anxiety who presents due to chest pain.  Complains of chest pain on and off over the past 2 to 3 weeks.  Symptoms are usually associated with stress especially while at work.  Had an episode of chest discomfort 2 weeks ago while leaving the beach.  Symptoms of chest pain with epigastric, associated with diaphoresis.  She thinks she may have had a panic episode.  Was taken to the ED, workup was unrevealing.  Has since followed up with primary care physician, started on Prozac , she thinks the Prozac  is helping.  Father had a heart attack in his 85s, father was also a smoker.  Sister has a defibrillator placed.  Past Medical History:  Diagnosis Date   Anemia    Anxiety    Cancer (HCC) 1993   ovarian CA- stage 1- no surgery needed   Depression    Obesity (BMI 30.0-34.9) 12/05/2020   44.25 inches Starting weight 178   Plantar fasciitis    Urinary, incontinence, stress female     Past Surgical History:  Procedure Laterality Date   CESAREAN SECTION     TUBAL LIGATION      Current Medications: Current Meds  Medication Sig   FLUoxetine  (PROZAC ) 10 MG capsule Take 1 capsule (10 mg total) by mouth daily for 14 days, THEN 2 capsules (20 mg total) daily for 16 days.   metoprolol tartrate (LOPRESSOR) 100 MG tablet TAKE 1 TABLET 2 HR PRIOR TO CARDIAC PROCEDURE   naproxen  (NAPROSYN ) 500 MG tablet Take 1 tablet (500 mg total)  by mouth 2 (two) times daily with a meal.     Allergies:   Patient has no known allergies.   Social History   Socioeconomic History   Marital status: Single    Spouse name: Not on file   Number of children: 3   Years of education: high school    Highest education level: Not on file  Occupational History   Not on file  Tobacco Use   Smoking status: Former    Current packs/day: 0.00    Average packs/day: 0.1 packs/day for 35.0 years (3.5 ttl pk-yrs)    Types: Cigarettes    Start date: 08/05/1986    Quit date: 08/04/2021    Years since quitting: 2.1   Smokeless tobacco: Never   Tobacco comments:    Former.  Vaping Use   Vaping status: Every Day   Substances: Nicotine, Flavoring  Substance and Sexual Activity   Alcohol use: Not Currently    Comment: occ   Drug use: No   Sexual activity: Yes    Birth control/protection: Post-menopausal  Other Topics Concern   Not on file  Social History Narrative   07/12/20   From: the area   Living: alone   Work: [employer]  Family: 3 children - Normand Beckwith, Candy, Valarie. 5 grandchildren - live nearby      Enjoys: spend time with grandchildren      Exercise: 13,000 steps at work   Diet: limiting carbs, no sodas, trying to eat healthy      Safety   Seat belts: Yes    Guns: No   Safe in relationships: Yes    Social Drivers of Corporate investment banker Strain: Not on file  Food Insecurity: Not on file  Transportation Needs: Not on file  Physical Activity: Not on file  Stress: Not on file  Social Connections: Unknown (09/18/2021)   Received from [facility]   Social Network    Social Network: Not on file     Family History: The patient's family history includes Colon cancer in her maternal grandfather; Diabetes in her maternal grandmother; Heart attack (age of onset: 76) in her father; Heart disease in her father; Hypertension in her maternal grandmother; Lung cancer in her maternal grandfather and  maternal grandmother; Ovarian cancer (age of onset: 60) in her mother. There is no history of Esophageal cancer, Rectal cancer, or Stomach cancer.  ROS:   Please see the history of present illness.     All other systems reviewed and are negative.  EKGs/Labs/Other Studies Reviewed:    The following studies were reviewed today:  EKG Interpretation Date/Time:  Thursday Sep 18 2023 10:54:31 EDT Ventricular Rate:  73 PR Interval:  126 QRS Duration:  104 QT Interval:  412 QTC Calculation: 453 R Axis:   -39  Text Interpretation: Normal sinus rhythm Left axis deviation Incomplete right bundle branch block Confirmed by Constancia Delton ([id number]) on 09/18/2023 11:09:57 AM    Recent Labs: 02/04/2023: ALT 17; BUN 11; Creatinine, Ser 0.78; Hemoglobin 15.2; Platelets 171.0; Potassium 4.1; Sodium 140; TSH 1.29  Recent Lipid Panel    Component Value Date/Time   CHOL 204 (H) 02/04/2023 0929   TRIG 138.0 02/04/2023 0929   HDL 59.90 02/04/2023 0929   CHOLHDL 3 02/04/2023 0929   VLDL 27.6 02/04/2023 0929   LDLCALC 117 (H) 02/04/2023 0929   LDLDIRECT 109.0 11/30/2021 1240     Risk Assessment/Calculations:               Physical Exam:    VS:  BP 115/62 (BP Location: Left Arm, Patient Position: Sitting, Cuff Size: Normal)   Pulse 73   Ht 5' 3.5" (1.613 m)   Wt 167 lb 3.2 oz (75.8 kg)   SpO2 97%   BMI 29.15 kg/m     Wt Readings from Last 3 Encounters:  09/18/23 167 lb 3.2 oz (75.8 kg)  08/28/23 166 lb 6.4 oz (75.5 kg)  07/09/23 168 lb 4 oz (76.3 kg)     GEN:  Well nourished, well developed in no acute distress HEENT: Normal NECK: No JVD; No carotid bruits CARDIAC: RRR, no murmurs, rubs, gallops RESPIRATORY:  Clear to auscultation without rales, wheezing or rhonchi  ABDOMEN: Soft, non-tender, non-distended MUSCULOSKELETAL:  No edema; No deformity  SKIN: Warm and dry NEUROLOGIC:  Alert and oriented x 3 PSYCHIATRIC:  Normal affect   ASSESSMENT:    1. Precordial pain   2.  Pure hypercholesterolemia   3. Other chest pain    PLAN:    In order of problems listed above:  Chest pain, family history of CAD.  Obtain echo, obtain coronary CTA.  Anxiety/stress could be contributing.  Reassure patient if no significant abnormalities noted on cardiac testing.  Hyperlipidemia, recommend low-cholesterol diet.  Follow-up after cardiac testing      Medication Adjustments/Labs and Tests Ordered: Current medicines are reviewed at length with the patient today.  Concerns regarding medicines are outlined above.  Orders Placed This Encounter  Procedures   CT CORONARY MORPH W/CTA COR W/SCORE W/CA W/CM &/OR WO/CM   Basic metabolic panel with GFR   EKG [id number]   ECHOCARDIOGRAM COMPLETE   Meds ordered this encounter  Medications   metoprolol tartrate (LOPRESSOR) 100 MG tablet    Sig: TAKE 1 TABLET 2 HR PRIOR TO CARDIAC PROCEDURE    Dispense:  1 tablet    Refill:  0    Patient Instructions  Medication Instructions:  Your physician recommends the following medication changes.  TAKE: Metoprolol 100 about 2 hours before your CTA  *If you need a refill on your cardiac medications before your next appointment, please call your pharmacy*  Lab Work: Your provider would like for you to have following labs drawn today BMeT.   If you have labs (blood work) drawn today and your tests are completely normal, you will receive your results only by: MyChart Message (if you have MyChart) OR A paper copy in the mail If you have any lab test that is abnormal or we need to change your treatment, we will call you to review the results.  Testing/Procedures: Your cardiac CT will be scheduled at :   [facility] [address] [place], [place] [address] [phone]  When scheduled at  [facility], please arrive 15 mins early for check-in and test prep.  Please follow these instructions carefully (unless otherwise directed):  An IV  will be required for this test and Nitroglycerin will be given.  Hold all erectile dysfunction medications at least 3 days (72 hrs) prior to test. (Ie viagra, cialis, sildenafil, tadalafil, etc)   On the Night Before the Test: Be sure to Drink plenty of water. Do not consume any caffeinated/decaffeinated beverages or chocolate 12 hours prior to your test. Do not take any antihistamines 12 hours prior to your test.  On the Day of the Test: Drink plenty of water until 1 hour prior to the test. Do not eat any food 1 hour prior to test. You may take your regular medications prior to the test.  Take metoprolol (Lopressor) two hours prior to test. Patients who wear a continuous glucose monitor MUST remove the device prior to scanning. FEMALES- please wear underwire-free bra if available, avoid dresses & tight clothing       After the Test: Drink plenty of water. After receiving IV contrast, you may experience a mild flushed feeling. This is normal. On occasion, you may experience a mild rash up to 24 hours after the test. This is not dangerous. If this occurs, you can take Benadryl 25 mg, Zyrtec, Claritin, or Allegra and increase your fluid intake. (Patients taking Tikosyn should avoid Benadryl, and may take Zyrtec, Claritin, or Allegra) If you experience trouble breathing, this can be serious. If it is severe call 911 IMMEDIATELY. If it is mild, please call our office.  We will call to schedule your test 2-4 weeks out understanding that some insurance companies will need an authorization prior to the service being performed.   For more information and frequently asked questions, please visit our website : [email or web address]  For non-scheduling related questions, please contact the cardiac imaging nurse navigator should you have any questions/concerns: Cardiac Imaging Nurse Navigators Direct Office Dial:  [phone]   For scheduling needs, including cancellations and  rescheduling, please call [place], [phone].   Your physician has requested that you have an echocardiogram. Echocardiography is a painless test that uses sound waves to create images of your heart. It provides your doctor with information about the size and shape of your heart and how well your heart's chambers and valves are working.   You may receive an ultrasound enhancing agent through an IV if needed to better visualize your heart during the echo. This procedure takes approximately one hour.  There are no restrictions for this procedure.  This will take place at [address] [facility] [address], [place] [address]  Please note: We ask at that you not bring children with you during ultrasound (echo/ vascular) testing. Due to room size and safety concerns, children are not allowed in the ultrasound rooms during exams. Our front office staff cannot provide observation of children in our lobby area while testing is being conducted. An adult accompanying a patient to their appointment will only be allowed in the ultrasound room at the discretion of the ultrasound technician under special circumstances. We apologize for any inconvenience.   Follow-Up: At [facility], you and your health needs are our priority.  As part of our continuing mission to provide you with exceptional heart care, our providers are all part of one team.  This team includes your primary Cardiologist (physician) and Advanced Practice Providers or APPs (Physician Assistants and Nurse Practitioners) who all work together to provide you with the care you need, when you need it.  Your next appointment:   2-3 month(s)  Provider:   Constancia Delton, MD    We recommend signing up for the patient portal called "MyChart".  Sign up information is provided on this After Visit Summary.  MyChart is used to connect with patients for Virtual Visits (Telemedicine).  Patients are able to view lab/test results,  encounter notes, upcoming appointments, etc.  Non-urgent messages can be sent to your provider as well.   To learn more about what you can do with MyChart, go to [email or web address].    Signed, Constancia Delton, MD  09/18/2023 11:45 AM    [facility]

## 2023-09-19 LAB — BASIC METABOLIC PANEL WITH GFR
BUN/Creatinine Ratio: 15 (ref 9–23)
BUN: 11 mg/dL (ref 6–24)
CO2: 23 mmol/L (ref 20–29)
Calcium: 9.2 mg/dL (ref 8.7–10.2)
Chloride: 103 mmol/L (ref 96–106)
Creatinine, Ser: 0.72 mg/dL (ref 0.57–1.00)
Glucose: 141 mg/dL — ABNORMAL HIGH (ref 70–99)
Potassium: 3.8 mmol/L (ref 3.5–5.2)
Sodium: 141 mmol/L (ref 134–144)
eGFR: 97 mL/min/{1.73_m2} (ref >59)

## 2023-09-23 ENCOUNTER — Other Ambulatory Visit: Payer: Self-pay | Admitting: Nurse Practitioner

## 2023-09-23 DIAGNOSIS — F419 Anxiety disorder, unspecified: Secondary | ICD-10-CM

## 2023-09-26 ENCOUNTER — Ambulatory Visit (INDEPENDENT_AMBULATORY_CARE_PROVIDER_SITE_OTHER): Admitting: Nurse Practitioner

## 2023-09-26 VITALS — BP 122/88 | HR 71 | Temp 98.2°F | Ht 63.5 in | Wt 168.2 lb

## 2023-09-26 DIAGNOSIS — R739 Hyperglycemia, unspecified: Secondary | ICD-10-CM | POA: Diagnosis not present

## 2023-09-26 DIAGNOSIS — F419 Anxiety disorder, unspecified: Secondary | ICD-10-CM | POA: Diagnosis not present

## 2023-09-26 DIAGNOSIS — R0789 Other chest pain: Secondary | ICD-10-CM | POA: Diagnosis not present

## 2023-09-26 LAB — POCT GLYCOSYLATED HEMOGLOBIN (HGB A1C): Hemoglobin A1C: 5.4 % (ref 4.0–5.6)

## 2023-09-26 NOTE — Patient Instructions (Signed)
 Nice to see you today A1C (sugar level check over a 3 month period) is 5.4, this is completely normal We will continue the Prozac  (fluoxetine ) Get the CT scan and Echocardiogram as scheduled Follow up with me in 5 months for your physical and labs

## 2023-09-26 NOTE — Assessment & Plan Note (Signed)
 A1c in office patient's glucose was 141 on recent BMP with cardiology.  Patient had diet soda and pretzels.  A1c of 5.4% in office today.

## 2023-09-26 NOTE — Progress Notes (Signed)
 Established Patient Office Visit  Subjective   Patient ID: Traci Oliver, female    DOB: Jun 07, 1965  Age: 58 y.o. MRN: [id number]  Chief Complaint  Patient presents with   Anxiety    Pt complains of doing good. No concerns of prozac .       Patient was seen by me on 08/28/2023 for hospital follow-up patient was having chest pain and he felt anxiety was having a contribution albeit patient had a cardiac history in her family.  Patient was started on Prozac  10 mg daily for 2 weeks then titrated up to 20 mg after.  She was also referred to cardiology  Anxiety: Patient currently maintained on fluoxetine  20 mg daily admitted for follow-up. States that she feels like it was a life saver. States that her tolerabilyt is better. States that anxiety has decreased. And feels like things are a lot smooth Sleep is good. States that she goes to bed 9 and up at 345. Feeling rested. She will take a nap after work   Chest pain: History of the same recently evaluated by cardiology on 09/18/2023.  They did an EKG in office also have her set up for an echocardiogram and a CTA coronary.  PET scan is scheduled for 10/09/2023.  Echocardiogram has not been completed as of yet.  Patient has a follow-up with cardiology on 11/21/2023. States that she has had some chest discomfort. States that she feels like she has some stress. She thinks it is coorelates to it and she is not hasivn shob nausea vomitng     Review of Systems  Constitutional:  Negative for chills and fever.  Respiratory:  Negative for shortness of breath.   Cardiovascular:  Positive for chest pain.  Neurological:  Negative for dizziness and headaches.  Psychiatric/Behavioral:  Negative for hallucinations and suicidal ideas.       Objective:     BP 122/88   Pulse 71   Temp 98.2 F (36.8 C) (Oral)   Ht 5' 3.5" (1.613 m)   Wt 168 lb 3.2 oz (76.3 kg)   SpO2 98%   BMI 29.33 kg/m  BP Readings from Last 3 Encounters:  09/26/23 122/88   09/18/23 115/62  08/28/23 118/82   Wt Readings from Last 3 Encounters:  09/26/23 168 lb 3.2 oz (76.3 kg)  09/18/23 167 lb 3.2 oz (75.8 kg)  08/28/23 166 lb 6.4 oz (75.5 kg)   SpO2 Readings from Last 3 Encounters:  09/26/23 98%  09/18/23 97%  08/28/23 97%      Physical Exam Vitals and nursing note reviewed.  Constitutional:      Appearance: Normal appearance.  Cardiovascular:     Rate and Rhythm: Normal rate and regular rhythm.     Heart sounds: Normal heart sounds.  Pulmonary:     Effort: Pulmonary effort is normal.     Breath sounds: Normal breath sounds.  Neurological:     Mental Status: She is alert.      Results for orders placed or performed in visit on 09/26/23  POCT glycosylated hemoglobin (Hb A1C)  Result Value Ref Range   Hemoglobin A1C 5.4 4.0 - 5.6 %   HbA1c POC (<> result, manual entry)     HbA1c, POC (prediabetic range)     HbA1c, POC (controlled diabetic range)        The 10-year ASCVD risk score (Arnett DK, et al., 2019) is: 2.8%    Assessment & Plan:   Problem List Items Addressed This Visit  Other   Anxiety   Patient currently maintained on fluoxetine  20 mg daily.  States that the medication has helped wonderfully.  Will continue medication as prescribed.  Patient denies any HI/SI/AVH.      Other chest pain   Patient to having intermittent chest pain that he correlates with stress.  Has been evaluated by cardiology.  Had echocardiogram and CT angio coronary arteries set up.  Encourage patient to complete testing as recommended and follow-up with cardiology as recommended      Hyperglycemia - Primary   A1c in office patient's glucose was 141 on recent BMP with cardiology.  Patient had diet soda and pretzels.  A1c of 5.4% in office today.      Relevant Orders   POCT glycosylated hemoglobin (Hb A1C) (Completed)    Return in about 5 months (around 02/26/2024) for CPE and Labs.    Margarie Shay, NP

## 2023-09-26 NOTE — Assessment & Plan Note (Signed)
 Patient to having intermittent chest pain that he correlates with stress.  Has been evaluated by cardiology.  Had echocardiogram and CT angio coronary arteries set up.  Encourage patient to complete testing as recommended and follow-up with cardiology as recommended

## 2023-09-26 NOTE — Assessment & Plan Note (Signed)
 Patient currently maintained on fluoxetine  20 mg daily.  States that the medication has helped wonderfully.  Will continue medication as prescribed.  Patient denies any HI/SI/AVH.

## 2023-09-30 ENCOUNTER — Ambulatory Visit

## 2023-10-09 ENCOUNTER — Ambulatory Visit: Admission: RE | Admit: 2023-10-09 | Source: Ambulatory Visit

## 2023-10-14 ENCOUNTER — Ambulatory Visit: Attending: Cardiology

## 2023-11-03 NOTE — Progress Notes (Signed)
 SIMPLE TREATMENT TODAY. sEEMS TO BE DOING WELL

## 2023-11-21 ENCOUNTER — Ambulatory Visit: Admitting: Cardiology

## 2023-12-19 ENCOUNTER — Ambulatory Visit (HOSPITAL_COMMUNITY)
Admission: RE | Admit: 2023-12-19 | Discharge: 2023-12-19 | Disposition: A | Discharge status: Home / Self Care | Source: Ambulatory Visit | Attending: Cardiology | Admitting: Cardiology

## 2023-12-19 DIAGNOSIS — R0789 Other chest pain: Secondary | ICD-10-CM | POA: Insufficient documentation

## 2023-12-19 DIAGNOSIS — R072 Precordial pain: Secondary | ICD-10-CM | POA: Diagnosis present

## 2023-12-19 LAB — ECHOCARDIOGRAM COMPLETE
Area-P 1/2: 3.31 cm2
S' Lateral: 3 cm

## 2023-12-22 ENCOUNTER — Ambulatory Visit: Payer: Self-pay | Admitting: Cardiology

## 2023-12-22 ENCOUNTER — Other Ambulatory Visit: Payer: Self-pay | Admitting: Nurse Practitioner

## 2023-12-22 DIAGNOSIS — F419 Anxiety disorder, unspecified: Secondary | ICD-10-CM

## 2023-12-23 ENCOUNTER — Telehealth: Payer: Self-pay | Admitting: Cardiology

## 2023-12-23 NOTE — Telephone Encounter (Signed)
 Patient is calling bout echo results. Please adivs

## 2023-12-23 NOTE — Telephone Encounter (Signed)
 Pt made aware of ECHO report and verbalized understanding.    Traci Cave, MD 12/22/2023  4:21 PM EDT     Echo with normal systolic function.  No findings to suggest etiology of chest pain.

## 2024-03-19 ENCOUNTER — Other Ambulatory Visit: Payer: Self-pay | Admitting: Family

## 2024-03-19 DIAGNOSIS — F419 Anxiety disorder, unspecified: Secondary | ICD-10-CM

## 2024-03-24 ENCOUNTER — Other Ambulatory Visit: Payer: Self-pay | Admitting: Nurse Practitioner

## 2024-03-24 DIAGNOSIS — F419 Anxiety disorder, unspecified: Secondary | ICD-10-CM

## 2024-03-24 MED ORDER — FLUOXETINE HCL 20 MG PO CAPS
20.0000 mg | ORAL_CAPSULE | Freq: Every day | ORAL | 0 refills | Status: AC
Start: 2024-03-24 — End: ?

## 2024-03-24 NOTE — Telephone Encounter (Signed)
 Copied from CRM #[id number]. Topic: Clinical - Medication Refill >> Mar 24, 2024  8:23 AM Burnard DEL wrote: Medication: FLUoxetine  (PROZAC ) 20 MG capsule  Has the patient contacted their pharmacy? No (Agent: If no, request that the patient contact the pharmacy for the refill. If patient does not wish to contact the pharmacy document the reason why and proceed with request.) (Agent: If yes, when and what did the pharmacy advise?)  This is the patient's preferred pharmacy:  [facility] [phone] [facility] [place] - [address] [place] [address] Phone: [phone] Fax: [phone]  Is this the correct pharmacy for this prescription? No If no, delete pharmacy and type the correct one.   Has the prescription been filled recently? No  Is the patient out of the medication? Yes  Has the patient been seen for an appointment in the last year OR does the patient have an upcoming appointment? Yes  Can we respond through MyChart? Yes  Agent: Please be advised that Rx refills may take up to 3 business days. We ask that you follow-up with your pharmacy.

## 2024-03-31 ENCOUNTER — Encounter: Payer: Self-pay | Admitting: Nurse Practitioner

## 2024-03-31 ENCOUNTER — Ambulatory Visit: Admitting: Obstetrics and Gynecology

## 2024-03-31 ENCOUNTER — Telehealth: Payer: Self-pay | Admitting: Nurse Practitioner

## 2024-03-31 ENCOUNTER — Encounter: Payer: Self-pay | Admitting: Emergency Medicine

## 2024-03-31 ENCOUNTER — Ambulatory Visit: Admitting: Nurse Practitioner

## 2024-03-31 VITALS — BP 118/84 | HR 81 | Temp 97.4°F | Ht 63.25 in | Wt 173.2 lb

## 2024-03-31 DIAGNOSIS — Z1211 Encounter for screening for malignant neoplasm of colon: Secondary | ICD-10-CM

## 2024-03-31 DIAGNOSIS — I10 Essential (primary) hypertension: Secondary | ICD-10-CM | POA: Diagnosis not present

## 2024-03-31 DIAGNOSIS — R051 Acute cough: Secondary | ICD-10-CM | POA: Diagnosis not present

## 2024-03-31 DIAGNOSIS — Z87891 Personal history of nicotine dependence: Secondary | ICD-10-CM

## 2024-03-31 DIAGNOSIS — Z0001 Encounter for general adult medical examination with abnormal findings: Secondary | ICD-10-CM

## 2024-03-31 DIAGNOSIS — E669 Obesity, unspecified: Secondary | ICD-10-CM | POA: Diagnosis not present

## 2024-03-31 DIAGNOSIS — Z126 Encounter for screening for malignant neoplasm of bladder: Secondary | ICD-10-CM

## 2024-03-31 DIAGNOSIS — Z1231 Encounter for screening mammogram for malignant neoplasm of breast: Secondary | ICD-10-CM

## 2024-03-31 DIAGNOSIS — F419 Anxiety disorder, unspecified: Secondary | ICD-10-CM | POA: Diagnosis not present

## 2024-03-31 DIAGNOSIS — J020 Streptococcal pharyngitis: Secondary | ICD-10-CM

## 2024-03-31 DIAGNOSIS — Z131 Encounter for screening for diabetes mellitus: Secondary | ICD-10-CM

## 2024-03-31 DIAGNOSIS — J029 Acute pharyngitis, unspecified: Secondary | ICD-10-CM | POA: Diagnosis not present

## 2024-03-31 DIAGNOSIS — M722 Plantar fascial fibromatosis: Secondary | ICD-10-CM

## 2024-03-31 DIAGNOSIS — G4489 Other headache syndrome: Secondary | ICD-10-CM | POA: Diagnosis not present

## 2024-03-31 DIAGNOSIS — Z122 Encounter for screening for malignant neoplasm of respiratory organs: Secondary | ICD-10-CM

## 2024-03-31 DIAGNOSIS — E782 Mixed hyperlipidemia: Secondary | ICD-10-CM

## 2024-03-31 LAB — POCT FLU A/B STATUS
Influenza A, POC: NEGATIVE
Influenza B, POC: NEGATIVE

## 2024-03-31 LAB — POC COVID19 BINAXNOW: SARS Coronavirus 2 Ag: NEGATIVE

## 2024-03-31 LAB — POCT RAPID STREP A (OFFICE): Rapid Strep A Screen: NEGATIVE

## 2024-03-31 MED ORDER — AZITHROMYCIN 250 MG PO TABS: ORAL_TABLET | ORAL | 0 refills | Status: AC

## 2024-03-31 MED ORDER — NAPROXEN 500 MG PO TABS: 500.0000 mg | ORAL_TABLET | Freq: Two times a day (BID) | ORAL | 0 refills | Status: AC

## 2024-03-31 NOTE — Patient Instructions (Signed)
 Nice to see you today I will be in touch with the labs once I have them   Call and schedule your mammogram at  [facility] [address] [place] [address]  [phone]

## 2024-03-31 NOTE — Assessment & Plan Note (Signed)
History of the same.  Pending lipid panel 

## 2024-03-31 NOTE — Assessment & Plan Note (Signed)
 Flu and COVID test in office.

## 2024-03-31 NOTE — Assessment & Plan Note (Signed)
 Pending urine microscopy to rule out microscopic hematuria

## 2024-03-31 NOTE — Assessment & Plan Note (Signed)
 Strep test faintly positive in office we will elect to treat with azithromycin  250 mg pack as directed.  This will also cover lungs in regards to smoking history

## 2024-03-31 NOTE — Progress Notes (Signed)
 Established Patient Office Visit  Subjective   Patient ID: Traci Oliver, female    DOB: 1966-01-17  Age: 58 y.o. MRN: [id number]  Chief Complaint  Patient presents with   Annual Exam    Pt requests refill of naproxen      HPI  HTN: Patient currently maintained on lifestyle modifications only.  Not currently on any antihypertensive medications  Mood: Currently maintained on Prozac  20 mg daily for anxiety  for complete physical and follow up of chronic conditions.  Immunizations: -Tetanus: Completed in 2024 -Influenza:refused  -Shingles: discussed in office -Pneumonia: discussed in office and will update when not feeling ill   Diet: Fair diet. She is eating 3 meals a day. She is not snacking. She is drinking water and coffee Exercise: She is doing the gym membership. She is doing ab workouts and walking. She is going two times a week   Eye exam: Completes annually. Wears glasses  Dental exam: Completes semi-annually    Colonoscopy: Completed in positive iFOB 2 years ago. [place] [facility] Screening: Needs ordered   Pap smear: 12/05/2020.  Negative cells negative HPV.  Patient due 2027  Mammogram: [place]  DEXA: Too young  Sleep: going to bed around 830-9 and get u p345. Feels rested.   Sore throat: blowing leaves fo rthe past 10 days. Not around any one sick. She has been uisng tylneol and allergy medications. She has had some relief.      Review of Systems  Constitutional:  Negative for chills and fever.  HENT:  Positive for ear pain, sinus pain and sore throat. Negative for ear discharge.   Respiratory:  Positive for cough and sputum production. Negative for shortness of breath.   Cardiovascular:  Negative for chest pain and leg swelling.  Gastrointestinal:  Negative for abdominal pain, blood in stool, constipation, diarrhea, nausea and vomiting.       BM daily   Genitourinary:  Negative for dysuria and hematuria.  Neurological:  Positive for  headaches. Negative for dizziness and tingling.  Psychiatric/Behavioral:  Negative for hallucinations and suicidal ideas.       Objective:     BP 118/84   Pulse 81   Temp (!) 97.4 F (36.3 C) (Oral)   Ht 5' 3.25 (1.607 m)   Wt 173 lb 3.2 oz (78.6 kg)   SpO2 95%   BMI 30.44 kg/m  BP Readings from Last 3 Encounters:  03/31/24 118/84  09/26/23 122/88  09/18/23 115/62   Wt Readings from Last 3 Encounters:  03/31/24 173 lb 3.2 oz (78.6 kg)  09/26/23 168 lb 3.2 oz (76.3 kg)  09/18/23 167 lb 3.2 oz (75.8 kg)   SpO2 Readings from Last 3 Encounters:  03/31/24 95%  09/26/23 98%  09/18/23 97%      Physical Exam Vitals and nursing note reviewed.  Constitutional:      Appearance: Normal appearance.  HENT:     Right Ear: Tympanic membrane, ear canal and external ear normal.     Left Ear: Tympanic membrane, ear canal and external ear normal.     Nose:     Right Sinus: Maxillary sinus tenderness present. No frontal sinus tenderness.     Left Sinus: Maxillary sinus tenderness present. No frontal sinus tenderness.     Mouth/Throat:     Mouth: Mucous membranes are moist.     Pharynx: Oropharynx is clear.  Eyes:     Extraocular Movements: Extraocular movements intact.     Pupils: Pupils are equal, round,  and reactive to light.  Cardiovascular:     Rate and Rhythm: Normal rate and regular rhythm.     Pulses: Normal pulses.     Heart sounds: Normal heart sounds.  Pulmonary:     Effort: Pulmonary effort is normal.     Breath sounds: Normal breath sounds.  Abdominal:     General: Bowel sounds are normal. There is no distension.     Palpations: There is no mass.     Tenderness: There is no abdominal tenderness.     Hernia: No hernia is present.  Musculoskeletal:     Right lower leg: No edema.     Left lower leg: No edema.  Lymphadenopathy:     Cervical: No cervical adenopathy.  Skin:    General: Skin is warm.  Neurological:     General: No focal deficit present.      Mental Status: She is alert.     Deep Tendon Reflexes:     Reflex Scores:      Bicep reflexes are 2+ on the right side and 2+ on the left side.      Patellar reflexes are 2+ on the right side and 2+ on the left side.    Comments: Bilateral upper and lower extremity strength 5/5  Psychiatric:        Mood and Affect: Mood normal.        Behavior: Behavior normal.        Thought Content: Thought content normal.        Judgment: Judgment normal.      Results for orders placed or performed in visit on 03/31/24  POC COVID-19 BinaxNow  Result Value Ref Range   SARS Coronavirus 2 Ag Negative Negative  Rapid Strep A  Result Value Ref Range   Rapid Strep A Screen Negative Negative  POCT Flu A & B Status  Result Value Ref Range   Influenza A, POC Negative Negative   Influenza B, POC Negative Negative      The 10-year ASCVD risk score (Arnett DK, et al., 2019) is: 2.2%    Assessment & Plan:   Problem List Items Addressed This Visit       Cardiovascular and Mediastinum   Essential hypertension   Patient currently maintained on lifestyle modifications only.  Blood pressure controlled        Respiratory   Strep throat   Strep test faintly positive in office we will elect to treat with azithromycin  250 mg pack as directed.  This will also cover lungs in regards to smoking history      Relevant Medications   azithromycin  (ZITHROMAX ) 250 MG tablet     Musculoskeletal and Integument   PLANTAR FASCIITIS, BILATERAL   Patient request refill of naproxen .  Refill provided      Relevant Medications   naproxen  (NAPROSYN ) 500 MG tablet     Other   Anxiety   Patient currently maintained on Prozac  20 mg daily.  Patient denies HI/SI/AVH.  Continue medication as prescribed      Obesity (BMI 30-39.9)   Patient was requesting to start back on phentermine .  She did not follow-up completely with cardiology defer treatment with stimulant      Relevant Orders   Hemoglobin A1c    Lipid panel   Other headache syndrome   Flu and COVID test in office.      Relevant Medications   naproxen  (NAPROSYN ) 500 MG tablet   Other Relevant Orders   POC COVID-19 BinaxNow (  Completed)   POCT Flu A & B Status (Completed)   Acute cough   Flu and COVID test in office.      Relevant Orders   POC COVID-19 BinaxNow (Completed)   POCT Flu A & B Status (Completed)   Mixed hyperlipidemia   History of the same.  Pending lipid panel      Relevant Orders   Hemoglobin A1c   Lipid panel   Former smoker   Pending urine microscopy to rule out microscopic hematuria      Relevant Orders   Urine Microscopic   Preventative health care - Primary   Discussed age-appropriate immunizations and screening exams.  Did review patient's personal, surgical, social, family history.  Patient is up-to-date with all age-appropriate vaccinations she would like.  Did discuss Prevnar 20 and shingles vaccine today.  Order placed to GI for CRC screening.  Order placed for mammogram.  Patient is up-to-date on cervical cancer screening.  Order placed for LDCT.  Patient was given information at discharge about preventative healthcare maintenance with anticipatory guidance.      Sore throat   COVID, flu, strep test in office.      Relevant Orders   POC COVID-19 BinaxNow (Completed)   Rapid Strep A (Completed)   POCT Flu A & B Status (Completed)   Other Visit Diagnoses       Screening for diabetes mellitus       Relevant Orders   Hemoglobin A1c     Screening for bladder cancer       Relevant Orders   Urine Microscopic     Screening for lung cancer       Relevant Orders   Ambulatory Referral Lung Cancer Screening [facility]     Screening mammogram for breast cancer       Relevant Orders   MM 3D SCREENING MAMMOGRAM BILATERAL BREAST     Screening for colon cancer       Relevant Orders   Ambulatory referral to Gastroenterology       Return in about 1 year (around 03/31/2025) for CPE  and Labs.    Adina Crandall, NP

## 2024-03-31 NOTE — Assessment & Plan Note (Signed)
 COVID, flu, strep test in office.

## 2024-03-31 NOTE — Assessment & Plan Note (Signed)
 Patient was requesting to start back on phentermine .  She did not follow-up completely with cardiology defer treatment with stimulant

## 2024-03-31 NOTE — Telephone Encounter (Signed)
 Hold patient out of work until 2 doses of antibiotics. I have sent note via mychart

## 2024-03-31 NOTE — Assessment & Plan Note (Signed)
 Discussed age-appropriate immunizations and screening exams.  Did review patient's personal, surgical, social, family history.  Patient is up-to-date with all age-appropriate vaccinations she would like.  Did discuss Prevnar 20 and shingles vaccine today.  Order placed to GI for CRC screening.  Order placed for mammogram.  Patient is up-to-date on cervical cancer screening.  Order placed for LDCT.  Patient was given information at discharge about preventative healthcare maintenance with anticipatory guidance.

## 2024-03-31 NOTE — Telephone Encounter (Signed)
 Copied from CRM [id number]. Topic: General - Call Back - No Documentation >> Mar 31, 2024 10:51 AM Rea BROCKS wrote: Reason for CRM: Patient called back and asked if she can receive an email or letter via Mychart letting her know if she can return to work or does she need to stay out of work due to symptoms. Patient is asking if letter can be uploaded or would she need to come and pick it up?   [phone] (M)

## 2024-03-31 NOTE — Telephone Encounter (Signed)
 Called and spoke with patient Informed her of work note sent via [employer]. Pt verbalized understanding and has no questions or concerns.

## 2024-03-31 NOTE — Assessment & Plan Note (Signed)
 Patient currently maintained on Prozac  20 mg daily.  Patient denies HI/SI/AVH.  Continue medication as prescribed

## 2024-03-31 NOTE — Assessment & Plan Note (Signed)
 Patient currently maintained on lifestyle modifications only.  Blood pressure controlled

## 2024-03-31 NOTE — Assessment & Plan Note (Signed)
 Patient request refill of naproxen .  Refill provided

## 2024-04-14 ENCOUNTER — Telehealth: Payer: Self-pay | Admitting: Nurse Practitioner

## 2024-04-14 NOTE — Telephone Encounter (Signed)
 Can we call and get patient scheduled for the labs that I ordered at her CPE please

## 2024-04-14 NOTE — Telephone Encounter (Signed)
 Thanks for the update

## 2024-04-14 NOTE — Telephone Encounter (Signed)
-----   Message from [facility] sent at 03/31/2024  9:37 AM EST ----- Regarding: Labs Did patient get her labs from her CPE

## 2024-04-14 NOTE — Telephone Encounter (Signed)
 Called pt. States she is switching doctors and will not need labs.

## 2024-04-16 DIAGNOSIS — Z419 Encounter for procedure for purposes other than remedying health state, unspecified: Secondary | ICD-10-CM | POA: Diagnosis not present

## 2024-04-19 ENCOUNTER — Ambulatory Visit: Admitting: Obstetrics and Gynecology

## 2024-05-19 ENCOUNTER — Ambulatory Visit: Admitting: Obstetrics and Gynecology

## 2024-06-03 ENCOUNTER — Encounter: Payer: Self-pay | Admitting: Obstetrics and Gynecology

## 2024-06-03 ENCOUNTER — Ambulatory Visit: Admitting: Obstetrics and Gynecology

## 2024-06-03 VITALS — BP 134/83 | HR 81 | Ht 63.0 in | Wt 175.0 lb

## 2024-06-03 DIAGNOSIS — Z1231 Encounter for screening mammogram for malignant neoplasm of breast: Secondary | ICD-10-CM

## 2024-06-03 DIAGNOSIS — Z1211 Encounter for screening for malignant neoplasm of colon: Secondary | ICD-10-CM

## 2024-06-03 NOTE — Progress Notes (Signed)
 Pt is new to office, changing providers.   Pt would like general labs and refill on Ditropan  today.

## 2024-06-03 NOTE — Progress Notes (Signed)
 Patient used to be with [facility] but she now has [facility] Medicaid and they don't take her insurance. She made an appointment with us  for a new PCP because on her card it lists us  as her PCP, which I confirmed on her card.   I told her that this is an OBGYN office and not a primary care office. I told her that I can set her up with a mammogram referral, as well as to see GI to go over colon cancer screening. Patient declines pap today (due next year). I told her I recommend contacting [facility] for a new PCP  No charge visit today.

## 2024-06-07 ENCOUNTER — Other Ambulatory Visit

## 2024-06-14 ENCOUNTER — Other Ambulatory Visit

## 2024-06-24 ENCOUNTER — Ambulatory Visit

## 2024-07-13 ENCOUNTER — Encounter: Admitting: General Practice
